# Patient Record
Sex: Female | Born: 1960
Health system: Southern US, Community
[De-identification: ages and names within clinical notes are randomized; demographics above are authoritative.]

## PROBLEM LIST (undated history)

## (undated) DIAGNOSIS — E0590 Thyrotoxicosis, unspecified without thyrotoxic crisis or storm: Secondary | ICD-10-CM

## (undated) DIAGNOSIS — N159 Renal tubulo-interstitial disease, unspecified: Secondary | ICD-10-CM

## (undated) DIAGNOSIS — M6740 Ganglion, unspecified site: Secondary | ICD-10-CM

## (undated) DIAGNOSIS — E785 Hyperlipidemia, unspecified: Secondary | ICD-10-CM

## (undated) DIAGNOSIS — M1990 Unspecified osteoarthritis, unspecified site: Secondary | ICD-10-CM

## (undated) HISTORY — PX: TONSILLECTOMY: SUR1361

## (undated) HISTORY — PX: KNEE ARTHROTOMY: SUR107

---

## 1991-03-01 HISTORY — PX: TUBAL LIGATION: SHX77

## 2005-05-03 ENCOUNTER — Encounter: Admission: RE | Admit: 2005-05-03 | Discharge: 2005-05-03 | Payer: Self-pay | Admitting: *Deleted

## 2006-09-04 ENCOUNTER — Encounter: Admission: RE | Admit: 2006-09-04 | Discharge: 2006-09-04 | Payer: Self-pay | Admitting: *Deleted

## 2011-05-02 ENCOUNTER — Encounter (HOSPITAL_COMMUNITY): Payer: Self-pay | Admitting: Pharmacist

## 2011-05-11 ENCOUNTER — Encounter (HOSPITAL_COMMUNITY): Payer: Self-pay

## 2011-05-11 ENCOUNTER — Encounter (HOSPITAL_COMMUNITY)
Admission: RE | Admit: 2011-05-11 | Discharge: 2011-05-11 | Disposition: A | Discharge status: Home / Self Care | Payer: PRIVATE HEALTH INSURANCE | Source: Ambulatory Visit | Attending: Obstetrics and Gynecology | Admitting: Obstetrics and Gynecology

## 2011-05-11 HISTORY — DX: Renal tubulo-interstitial disease, unspecified: N15.9

## 2011-05-11 HISTORY — DX: Thyrotoxicosis, unspecified without thyrotoxic crisis or storm: E05.90

## 2011-05-11 LAB — SURGICAL PCR SCREEN: MRSA, PCR: NEGATIVE

## 2011-05-11 LAB — CBC
Hemoglobin: 12.9 g/dL (ref 12.0–15.0)
MCH: 31.5 pg (ref 26.0–34.0)
MCHC: 33.4 g/dL (ref 30.0–36.0)
WBC: 4.9 10*3/uL (ref 4.0–10.5)

## 2011-05-11 NOTE — H&P (Signed)
Heather Griffin is an 51 y.o. female G2P2 S/P BTL with complaints of increasing pelvic pressure, urinary frequency and discomfort with intercourse. U/S in office showed various fibroids ranging from about 2.7 to 9.0 cm.  There was a 2.5 cm cyst of right ovary.   Pertinent Gynecological History: Menses: irregular occurring approximately every 30-60 days with spotting approximately few days per month Bleeding: as above Contraception: tubal ligation DES exposure: unknown Blood transfusions: none Sexually transmitted diseases: no past history Previous GYN Procedures: BTL  Last mammogram: normal Date: 2013 Last pap: normal Date: 2013 OB History: G2, P2   Menstrual History: Menarche age: unknown No LMP recorded.    No past medical history on file.  No past surgical history on file.  No family history on file.  Social History:  does not have a smoking history on file. She does not have any smokeless tobacco history on file. Her alcohol and drug histories not on file.  Allergies:  Allergies  Allergen Reactions  . Erythromycin Nausea And Vomiting    No prescriptions prior to admission    Review of Systems  Respiratory: Negative for shortness of breath.   Cardiovascular: Negative for chest pain.  Genitourinary: Positive for frequency.    There were no vitals taken for this visit. Physical Exam  Constitutional: She appears well-developed and well-nourished.  Cardiovascular: Normal rate and regular rhythm.   Respiratory: Effort normal and breath sounds normal.  GI: She exhibits no distension.  Genitourinary:       Uterus 16 weeks sized, irregular contour, nontender Compromised adnexal exam    No results found for this or any previous visit (from the past 24 hour(s)).  No results found.  Assessment/Plan: 51 yo with symptomatic uterine leiomyomata.  Reviewed options. Scheduled for TAH/BSO.  Risks reviewed.  Heather Griffin,Heather Griffin 05/11/2011, 10:39 AM

## 2011-05-11 NOTE — Patient Instructions (Signed)
YOUR PROCEDURE IS SCHEDULED ON:05/16/11  ENTER THROUGH THE MAIN ENTRANCE OF [facility] AT:6am  USE DESK PHONE AND DIAL [phone] TO INFORM us OF YOUR ARRIVAL  CALL [phone] IF YOU HAVE ANY QUESTIONS OR PROBLEMS PRIOR TO YOUR ARRIVAL.  REMEMBER: DO NOT EAT OR DRINK AFTER MIDNIGHT :Sunday  SPECIAL INSTRUCTIONS:   YOU MAY BRUSH YOUR TEETH THE MORNING OF SURGERY   TAKE THESE MEDICINES THE DAY OF SURGERY WITH SIP OF WATER:none   DO NOT WEAR JEWELRY, EYE MAKEUP, LIPSTICK OR DARK FINGERNAIL POLISH DO NOT WEAR LOTIONS  DO NOT SHAVE FOR 48 HOURS PRIOR TO SURGERY  YOU WILL NOT BE ALLOWED TO DRIVE YOURSELF HOME.  NAME OF DRIVER: Jonny Ruiz

## 2011-05-16 ENCOUNTER — Inpatient Hospital Stay (HOSPITAL_COMMUNITY): Payer: PRIVATE HEALTH INSURANCE | Admitting: Anesthesiology

## 2011-05-16 ENCOUNTER — Encounter (HOSPITAL_COMMUNITY): Payer: Self-pay | Admitting: Anesthesiology

## 2011-05-16 ENCOUNTER — Inpatient Hospital Stay (HOSPITAL_COMMUNITY)
Admission: RE | Admit: 2011-05-16 | Discharge: 2011-05-18 | DRG: 743 | Disposition: A | Discharge status: Home / Self Care | Payer: PRIVATE HEALTH INSURANCE | Source: Ambulatory Visit | Attending: Obstetrics and Gynecology | Admitting: Obstetrics and Gynecology

## 2011-05-16 ENCOUNTER — Encounter (HOSPITAL_COMMUNITY): Payer: Self-pay | Admitting: *Deleted

## 2011-05-16 ENCOUNTER — Encounter (HOSPITAL_COMMUNITY)
Admission: RE | Disposition: A | Discharge status: Home / Self Care | Payer: Self-pay | Source: Ambulatory Visit | Attending: Obstetrics and Gynecology

## 2011-05-16 DIAGNOSIS — N8300 Follicular cyst of ovary, unspecified side: Secondary | ICD-10-CM | POA: Diagnosis present

## 2011-05-16 DIAGNOSIS — Z01812 Encounter for preprocedural laboratory examination: Secondary | ICD-10-CM

## 2011-05-16 DIAGNOSIS — Z01818 Encounter for other preprocedural examination: Secondary | ICD-10-CM

## 2011-05-16 DIAGNOSIS — N949 Unspecified condition associated with female genital organs and menstrual cycle: Secondary | ICD-10-CM | POA: Diagnosis present

## 2011-05-16 DIAGNOSIS — D252 Subserosal leiomyoma of uterus: Principal | ICD-10-CM | POA: Diagnosis present

## 2011-05-16 HISTORY — PX: SALPINGOOPHORECTOMY: SHX82

## 2011-05-16 HISTORY — PX: ABDOMINAL HYSTERECTOMY: SHX81

## 2011-05-16 SURGERY — HYSTERECTOMY, ABDOMINAL
Anesthesia: General | Site: Abdomen | Wound class: Clean Contaminated

## 2011-05-16 MED ORDER — ONDANSETRON HCL 4 MG/2ML IJ SOLN: 4.0000 mg | Freq: Four times a day (QID) | INTRAMUSCULAR | Status: DC | PRN

## 2011-05-16 MED ORDER — MEPERIDINE HCL 25 MG/ML IJ SOLN
6.2500 mg | INTRAMUSCULAR | Status: DC | PRN
Start: 2011-05-16 — End: 2011-05-16

## 2011-05-16 MED ORDER — LIDOCAINE HCL (CARDIAC) 20 MG/ML IV SOLN
INTRAVENOUS | Status: AC
  Filled 2011-05-16: qty 5

## 2011-05-16 MED ORDER — HYDROMORPHONE 0.3 MG/ML IV SOLN
INTRAVENOUS | Status: AC
  Filled 2011-05-16: qty 25

## 2011-05-16 MED ORDER — PROPOFOL 10 MG/ML IV EMUL
INTRAVENOUS | Status: AC
  Filled 2011-05-16: qty 20

## 2011-05-16 MED ORDER — ALUM & MAG HYDROXIDE-SIMETH 200-200-20 MG/5ML PO SUSP: 30.0000 mL | ORAL | Status: DC | PRN

## 2011-05-16 MED ORDER — ROCURONIUM BROMIDE 100 MG/10ML IV SOLN
INTRAVENOUS | Status: DC | PRN
  Administered 2011-05-16: 10 mg via INTRAVENOUS
  Administered 2011-05-16: 40 mg via INTRAVENOUS

## 2011-05-16 MED ORDER — DEXAMETHASONE SODIUM PHOSPHATE 4 MG/ML IJ SOLN
INTRAMUSCULAR | Status: DC | PRN
  Administered 2011-05-16: 10 mg via INTRAVENOUS

## 2011-05-16 MED ORDER — NALOXONE HCL 0.4 MG/ML IJ SOLN: 0.4000 mg | INTRAMUSCULAR | Status: DC | PRN

## 2011-05-16 MED ORDER — GLYCOPYRROLATE 0.2 MG/ML IJ SOLN
INTRAMUSCULAR | Status: DC | PRN
  Administered 2011-05-16: 0.2 mg via INTRAVENOUS
  Administered 2011-05-16: 0.4 mg via INTRAVENOUS

## 2011-05-16 MED ORDER — HYDROMORPHONE HCL PF 1 MG/ML IJ SOLN
INTRAMUSCULAR | Status: AC
  Filled 2011-05-16: qty 1

## 2011-05-16 MED ORDER — LACTATED RINGERS IV SOLN
INTRAVENOUS | Status: DC
  Administered 2011-05-16 – 2011-05-17 (×2): via INTRAVENOUS

## 2011-05-16 MED ORDER — FENTANYL CITRATE 0.05 MG/ML IJ SOLN
INTRAMUSCULAR | Status: AC
  Filled 2011-05-16: qty 5

## 2011-05-16 MED ORDER — HYDROMORPHONE HCL PF 1 MG/ML IJ SOLN
INTRAMUSCULAR | Status: AC
  Administered 2011-05-16: 0.5 mg via INTRAVENOUS
  Filled 2011-05-16: qty 1

## 2011-05-16 MED ORDER — SCOPOLAMINE 1 MG/3DAYS TD PT72
1.0000 | MEDICATED_PATCH | TRANSDERMAL | Status: DC
  Administered 2011-05-16: 1.5 mg via TRANSDERMAL

## 2011-05-16 MED ORDER — HYDROMORPHONE HCL PF 1 MG/ML IJ SOLN
0.2500 mg | INTRAMUSCULAR | Status: DC | PRN
  Administered 2011-05-16 (×5): 0.5 mg via INTRAVENOUS

## 2011-05-16 MED ORDER — ONDANSETRON HCL 4 MG/2ML IJ SOLN
INTRAMUSCULAR | Status: AC
  Filled 2011-05-16: qty 2

## 2011-05-16 MED ORDER — SCOPOLAMINE 1 MG/3DAYS TD PT72: 1.0000 | MEDICATED_PATCH | Freq: Once | TRANSDERMAL | Status: DC

## 2011-05-16 MED ORDER — KETOROLAC TROMETHAMINE 30 MG/ML IJ SOLN
30.0000 mg | Freq: Four times a day (QID) | INTRAMUSCULAR | Status: AC
  Administered 2011-05-16 (×2): 30 mg via INTRAVENOUS
  Filled 2011-05-16 (×2): qty 1

## 2011-05-16 MED ORDER — HYDROMORPHONE HCL PF 1 MG/ML IJ SOLN
INTRAMUSCULAR | Status: DC | PRN
  Administered 2011-05-16: 1 mg via INTRAVENOUS

## 2011-05-16 MED ORDER — DIPHENHYDRAMINE HCL 12.5 MG/5ML PO ELIX: 12.5000 mg | ORAL_SOLUTION | Freq: Four times a day (QID) | ORAL | Status: DC | PRN

## 2011-05-16 MED ORDER — MIDAZOLAM HCL 5 MG/5ML IJ SOLN
INTRAMUSCULAR | Status: DC | PRN
  Administered 2011-05-16: 2 mg via INTRAVENOUS

## 2011-05-16 MED ORDER — ROCURONIUM BROMIDE 50 MG/5ML IV SOLN
INTRAVENOUS | Status: AC
  Filled 2011-05-16: qty 1

## 2011-05-16 MED ORDER — GLYCOPYRROLATE 0.2 MG/ML IJ SOLN
INTRAMUSCULAR | Status: AC
  Filled 2011-05-16: qty 1

## 2011-05-16 MED ORDER — ONDANSETRON HCL 4 MG PO TABS: 4.0000 mg | ORAL_TABLET | Freq: Four times a day (QID) | ORAL | Status: DC | PRN

## 2011-05-16 MED ORDER — LACTATED RINGERS IV SOLN
INTRAVENOUS | Status: DC
  Administered 2011-05-16 (×3): via INTRAVENOUS

## 2011-05-16 MED ORDER — FENTANYL CITRATE 0.05 MG/ML IJ SOLN
INTRAMUSCULAR | Status: DC | PRN
  Administered 2011-05-16: 100 ug via INTRAVENOUS
  Administered 2011-05-16: 150 ug via INTRAVENOUS

## 2011-05-16 MED ORDER — SODIUM CHLORIDE 0.9 % IJ SOLN: 9.0000 mL | INTRAMUSCULAR | Status: DC | PRN

## 2011-05-16 MED ORDER — LIDOCAINE HCL (CARDIAC) 20 MG/ML IV SOLN
INTRAVENOUS | Status: DC | PRN
  Administered 2011-05-16: 50 mg via INTRAVENOUS

## 2011-05-16 MED ORDER — MIDAZOLAM HCL 2 MG/2ML IJ SOLN
INTRAMUSCULAR | Status: AC
  Filled 2011-05-16: qty 2

## 2011-05-16 MED ORDER — DIPHENHYDRAMINE HCL 50 MG/ML IJ SOLN: 12.5000 mg | Freq: Four times a day (QID) | INTRAMUSCULAR | Status: DC | PRN

## 2011-05-16 MED ORDER — METOCLOPRAMIDE HCL 5 MG/ML IJ SOLN: 10.0000 mg | Freq: Once | INTRAMUSCULAR | Status: DC | PRN

## 2011-05-16 MED ORDER — OXYCODONE-ACETAMINOPHEN 5-325 MG PO TABS
1.0000 | ORAL_TABLET | ORAL | Status: DC | PRN
  Administered 2011-05-17: 1 via ORAL
  Administered 2011-05-18 (×2): 2 via ORAL
  Filled 2011-05-16 (×2): qty 2
  Filled 2011-05-16: qty 1

## 2011-05-16 MED ORDER — KETOROLAC TROMETHAMINE 30 MG/ML IJ SOLN
INTRAMUSCULAR | Status: DC | PRN
  Administered 2011-05-16: 30 mg via INTRAVENOUS

## 2011-05-16 MED ORDER — ZOLPIDEM TARTRATE 5 MG PO TABS: 5.0000 mg | ORAL_TABLET | Freq: Every evening | ORAL | Status: DC | PRN

## 2011-05-16 MED ORDER — CEFAZOLIN SODIUM 1-5 GM-% IV SOLN
1.0000 g | INTRAVENOUS | Status: AC
  Administered 2011-05-16: 1 g via INTRAVENOUS

## 2011-05-16 MED ORDER — DOCUSATE SODIUM 100 MG PO CAPS
100.0000 mg | ORAL_CAPSULE | Freq: Two times a day (BID) | ORAL | Status: DC
  Administered 2011-05-16 – 2011-05-18 (×4): 100 mg via ORAL
  Filled 2011-05-16 (×4): qty 1

## 2011-05-16 MED ORDER — PROPOFOL 10 MG/ML IV EMUL
INTRAVENOUS | Status: DC | PRN
  Administered 2011-05-16: 150 mg via INTRAVENOUS

## 2011-05-16 MED ORDER — HYDROMORPHONE 0.3 MG/ML IV SOLN
INTRAVENOUS | Status: DC
  Administered 2011-05-16: 10:00:00 via INTRAVENOUS
  Administered 2011-05-16: 1.33 mg via INTRAVENOUS
  Administered 2011-05-17: 0.67 mL via INTRAVENOUS

## 2011-05-16 MED ORDER — SCOPOLAMINE 1 MG/3DAYS TD PT72
MEDICATED_PATCH | TRANSDERMAL | Status: AC
  Administered 2011-05-16: 1.5 mg via TRANSDERMAL
  Filled 2011-05-16: qty 1

## 2011-05-16 MED ORDER — NEOSTIGMINE METHYLSULFATE 1 MG/ML IJ SOLN
INTRAMUSCULAR | Status: AC
  Filled 2011-05-16: qty 10

## 2011-05-16 MED ORDER — KETOROLAC TROMETHAMINE 30 MG/ML IJ SOLN
INTRAMUSCULAR | Status: AC
  Filled 2011-05-16: qty 1

## 2011-05-16 MED ORDER — ONDANSETRON HCL 4 MG/2ML IJ SOLN
INTRAMUSCULAR | Status: DC | PRN
  Administered 2011-05-16: 4 mg via INTRAVENOUS

## 2011-05-16 MED ORDER — CEFAZOLIN SODIUM 1-5 GM-% IV SOLN
INTRAVENOUS | Status: AC
  Filled 2011-05-16: qty 50

## 2011-05-16 MED ORDER — MENTHOL 3 MG MT LOZG: 1.0000 | LOZENGE | OROMUCOSAL | Status: DC | PRN

## 2011-05-16 MED ORDER — NEOSTIGMINE METHYLSULFATE 1 MG/ML IJ SOLN
INTRAMUSCULAR | Status: DC | PRN
  Administered 2011-05-16: 2 mg via INTRAVENOUS

## 2011-05-16 SURGICAL SUPPLY — 32 items
APL SKNCLS STERI-STRIP NONHPOA (GAUZE/BANDAGES/DRESSINGS) ×2
BENZOIN TINCTURE PRP APPL 2/3 (GAUZE/BANDAGES/DRESSINGS) ×1 IMPLANT
CANISTER SUCTION 2500CC (MISCELLANEOUS) ×3 IMPLANT
CLOTH BEACON ORANGE TIMEOUT ST (SAFETY) ×3 IMPLANT
CONT PATH 16OZ SNAP LID 3702 (MISCELLANEOUS) ×3 IMPLANT
DECANTER SPIKE VIAL GLASS SM (MISCELLANEOUS) IMPLANT
DRESSING TELFA 8X3 (GAUZE/BANDAGES/DRESSINGS) ×1 IMPLANT
ELECT LIGASURE SHORT 9 REUSE (ELECTRODE) IMPLANT
GLOVE BIO SURGEON STRL SZ8 (GLOVE) ×6 IMPLANT
GOWN PREVENTION PLUS LG XLONG (DISPOSABLE) ×6 IMPLANT
NS IRRIG 1000ML POUR BTL (IV SOLUTION) ×3 IMPLANT
PACK ABDOMINAL GYN (CUSTOM PROCEDURE TRAY) ×3 IMPLANT
PAD ABD 7.5X8 STRL (GAUZE/BANDAGES/DRESSINGS) ×1 IMPLANT
PAD OB MATERNITY 4.3X12.25 (PERSONAL CARE ITEMS) ×3 IMPLANT
PROTECTOR NERVE ULNAR (MISCELLANEOUS) ×3 IMPLANT
SPONGE GAUZE 4X4 12PLY (GAUZE/BANDAGES/DRESSINGS) ×1 IMPLANT
SPONGE LAP 18X18 X RAY DECT (DISPOSABLE) ×6 IMPLANT
STAPLER VISISTAT 35W (STAPLE) ×3 IMPLANT
STRIP CLOSURE SKIN 1/4X3 (GAUZE/BANDAGES/DRESSINGS) ×1 IMPLANT
STRIP CLOSURE SKIN 1/4X4 (GAUZE/BANDAGES/DRESSINGS) ×3 IMPLANT
SUT MNCRL 0 MO-4 VIOLET 18 CR (SUTURE) ×6 IMPLANT
SUT MNCRL 0 VIOLET 6X18 (SUTURE) ×2 IMPLANT
SUT MNCRL AB 0 CT1 27 (SUTURE) ×3 IMPLANT
SUT MONOCRYL 0 6X18 (SUTURE) ×1
SUT MONOCRYL 0 MO 4 18  CR/8 (SUTURE) ×3
SUT PDS AB 0 CTX 60 (SUTURE) ×6 IMPLANT
SUT PLAIN 2 0 XLH (SUTURE) IMPLANT
SYR CONTROL 10ML LL (SYRINGE) IMPLANT
TAPE CLOTH SURG 4X10 WHT LF (GAUZE/BANDAGES/DRESSINGS) ×1 IMPLANT
TOWEL OR 17X24 6PK STRL BLUE (TOWEL DISPOSABLE) ×6 IMPLANT
TRAY FOLEY CATH 14FR (SET/KITS/TRAYS/PACK) ×3 IMPLANT
WATER STERILE IRR 1000ML POUR (IV SOLUTION) ×2 IMPLANT

## 2011-05-16 NOTE — Anesthesia Postprocedure Evaluation (Signed)
  Anesthesia Post-op Note  Patient: Heather Griffin  Procedure(s) Performed: Procedure(s) (LRB): HYSTERECTOMY ABDOMINAL (N/A) SALPINGO OOPHERECTOMY (Bilateral)  Patient Location: [facility]  Anesthesia Type: General  Level of Consciousness: alert  and oriented  Airway and Oxygen Therapy: Patient Spontanous Breathing  Post-op Pain: mild  Post-op Assessment: Patient's Cardiovascular Status Stable and Respiratory Function Stable  Post-op Vital Signs: stable  Complications: No apparent anesthesia complications

## 2011-05-16 NOTE — Anesthesia Postprocedure Evaluation (Signed)
  Anesthesia Post-op Note  Patient: Heather Griffin  Procedure(s) Performed: Procedure(s) (LRB): HYSTERECTOMY ABDOMINAL (N/A) SALPINGO OOPHERECTOMY (Bilateral)  Patient Location: PACU  Anesthesia Type: General  Level of Consciousness: awake, alert  and oriented  Airway and Oxygen Therapy: Patient Spontanous Breathing  Post-op Pain: mild  Post-op Assessment: Post-op Vital signs reviewed, Patient's Cardiovascular Status Stable, Respiratory Function Stable, Patent Airway, No signs of Nausea or vomiting, Adequate PO intake and Pain level controlled  Post-op Vital Signs: Reviewed and stable  Complications: No apparent anesthesia complications

## 2011-05-16 NOTE — Anesthesia Preprocedure Evaluation (Signed)
Anesthesia Evaluation  Patient identified by MRN, date of birth, ID band Patient awake    Reviewed: Allergy & Precautions, H&P , Patient's Chart, lab work & pertinent test results  Airway Mallampati: II TM Distance: >3 FB Neck ROM: full    Dental No notable dental hx. (+) Teeth Intact   Pulmonary neg pulmonary ROS,  breath sounds clear to auscultation  Pulmonary exam normal       Cardiovascular negative cardio ROS  Rhythm:regular Rate:Normal     Neuro/Psych negative neurological ROS  negative psych ROS   GI/Hepatic negative GI ROS, Neg liver ROS,   Endo/Other  Hyperthyroidism   Renal/GU negative Renal ROS  negative genitourinary   Musculoskeletal   Abdominal   Peds  Hematology negative hematology ROS (+)   Anesthesia Other Findings   Reproductive/Obstetrics (+) Pregnancy                           Anesthesia Physical Anesthesia Plan  ASA: II  Anesthesia Plan: General ETT   Post-op Pain Management:    Induction:   Airway Management Planned:   Additional Equipment:   Intra-op Plan:   Post-operative Plan:   Informed Consent: I have reviewed the patients History and Physical, chart, labs and discussed the procedure including the risks, benefits and alternatives for the proposed anesthesia with the patient or authorized representative who has indicated his/her understanding and acceptance.   Dental Advisory Given  Plan Discussed with: Anesthesiologist, Surgeon and CRNA  Anesthesia Plan Comments:         Anesthesia Quick Evaluation

## 2011-05-16 NOTE — Op Note (Signed)
Heather Griffin, BEVERLEY NO.:  [id number]  MEDICAL RECORD NO.:  [id number]  LOCATION:  9303                          FACILITY:  [facility]  PHYSICIAN:  Guy Sandifer. Henderson Cloud, M.D. DATE OF BIRTH:  [birth year]  DATE OF PROCEDURE:  05/16/2011 DATE OF DISCHARGE:                              OPERATIVE REPORT   PREOPERATIVE DIAGNOSIS:  Uterine leiomyomata.  POSTOPERATIVE DIAGNOSIS:  Uterine leiomyomata.  PROCEDURE:  Total abdominal hysterectomy with bilateral salpingo- oophorectomy.  SURGEON:  Guy Sandifer. Henderson Cloud, MD.  ASSISTANTDuke Salvia. Marcelle Overlie, MD.  ANESTHESIA:  General endotracheal intubation.  ANESTHESIOLOGIST:  Angelica Pou, MD.  ESTIMATED BLOOD LOSS:  150 mL.  SPECIMENS:  Uterus with cervix, bilateral tubes and ovaries to Pathology.  ESTIMATED BLOOD LOSS:  150 mL.  INDICATIONS AND CONSENT: This patient is a 51 year old married white female with symptomatic uterine leiomyomata.  After discussion of options, she is admitted for total abdominal hysterectomy with bilateral salpingo-oophorectomy. Procedures have been discussed preoperatively.  Potential risks and complications have been reviewed preoperatively including but not limited to infection, organ damage, bleeding requiring transfusion of blood products with HIV and hepatitis acquisition, DVT, PE, pneumonia, fistula formation, pelvic pain.  All questions have been answered and consent is signed on the chart.  FINDINGS:  Uterus is distorted with multiple leiomyomata both at the fundus and posterior uterine fundus to about 14-16 weeks size.  Tubes and ovaries normal bilaterally.  PROCEDURE IN DETAIL:  The patient was taken to the operating room, where she was identified, placed in dorsal supine position, and general anesthesia was induced via endotracheal intubation.  She was prepped abdominally and vaginally.  Foley catheter was placed and the bladder was drained.  She has draped in sterile fashion.   Time-out undertaken. Skin was entered through a Pfannenstiel incision and dissection was carried out in layers to the peritoneum, which was entered and extended superiorly and inferiorly.  Andrey Spearman retractor was placed. Bladder blade was placed.  The bowel was packed away and blade was placed.  After elevating specimen, Kelly clamps were placed on the proximal ligaments.  The left round ligament was identified, taken down with bipolar cautery, and anterior leaf of the broad ligament was taken down around the vesicouterine peritoneum as well.  Posterior leaf was bluntly perforated.  After verifying it is well clear of the pelvic sidewall and the ureter, the infundibulopelvic ligament was clamped, taken down, and doubly ligated with a free tie and a suture.  The left uterine vessels were further skeletonized and the bladder was advanced. Progressive bites with straight clamps were taken down to the uterine vessels.  Similar procedure was carried out on the right side.  After getting well below the uterine vessels bilaterally, the uterine fundus was amputated under good visualization.  At this point, uterosacral ligaments were taken down bilaterally with curved clamps, the vagina was entered and the specimen was removed with scissors.  The entire cervix was removed.  Cuff was then closed with interrupted figure-of-eight sutures.  All suture of 0 Monocryl unless otherwise designated. Uterosacral ligaments are plicated in the midline with a suture at the distal most portion of the uterosacral ligament and  the angle sutures are tied in the midline as well.  Copious irrigation was carried out and excellent hemostasis was noted all around.  Course of the ureters was identified again bilaterally and seem to be well clear of the area of surgery.  Packs were removed.  Retractors were removed.  Anterior peritoneum was closed in running fashion with 0 Monocryl suture, which was also used to  reapproximate the pyramidalis muscle in the midline. Anterior rectus fascia was closed in a running fashion with a 0 looped PDS.  The subcuticular tissue was closed with interrupted plain suture and the skin was closed with a subcuticular Vicryl on a Keith needle. Dressings were applied.  All counts correct.  The patient was awakened and taken to recovery room in stable condition.     Guy Sandifer Henderson Cloud, M.D.     JET/MEDQ  D:  05/16/2011  T:  05/16/2011  Job:  [id number]

## 2011-05-16 NOTE — Brief Op Note (Signed)
05/16/2011  8:39 AM  PATIENT:  Heather Griffin  51 y.o. female with symptomatic uterine leiyomata PRE-OPERATIVE DIAGNOSIS:  fibroids  POST-OPERATIVE DIAGNOSIS:  fibroids  PROCEDURE:  Procedure(s) (LRB): Total HYSTERECTOMY ABDOMINAL (N/A) SALPINGO OOPHERECTOMY (Bilateral)  SURGEON:  Surgeon(s) and Role:    * Leslie Andrea, MD - Primary  PHYSICIAN ASSISTANT:   ASSISTANTS: Richarda Overlie MD   ANESTHESIA:   general  EBL:  Total I/O In: 1000 [I.V.:1000] Out: 450 [Urine:300; Blood:150]  BLOOD ADMINISTERED:none  DRAINS: Urinary Catheter (Foley)   LOCAL MEDICATIONS USED:  NONE  SPECIMEN:  Source of Specimen:  Uterus, bilateral tubes/ovaries  DISPOSITION OF SPECIMEN:  PATHOLOGY  COUNTS:  YES  TOURNIQUET:  * No tourniquets in log *  DICTATION: .Other Dictation: Dictation Number   PLAN OF CARE: Admit to inpatient   PATIENT DISPOSITION:  PACU - hemodynamically stable.   Delay start of Pharmacological VTE agent (>24hrs) due to surgical blood loss or risk of bleeding: not applicable

## 2011-05-16 NOTE — Addendum Note (Signed)
Addendum  created 05/16/11 1602 by Renford Dills, CRNA   Modules edited:Notes Section

## 2011-05-16 NOTE — Progress Notes (Signed)
No changes to H&P  Reviewed TAH/BSO-risks, all questions answered

## 2011-05-16 NOTE — Anesthesia Procedure Notes (Signed)
Date/Time: 05/16/2011 7:31 AM Performed by: Shanon Payor Pre-anesthesia Checklist: Patient identified, Emergency Drugs available, Suction available, Patient being monitored and Timeout performed Patient Re-evaluated:Patient Re-evaluated prior to inductionOxygen Delivery Method: Circle system utilized Preoxygenation: Pre-oxygenation with 100% oxygen Intubation Type: IV induction Ventilation: Mask ventilation without difficulty Laryngoscope Size: Mac and 3 Grade View: Grade II Tube type: Oral Tube size: 7.0 mm Number of attempts: 1 Airway Equipment and Method: Stylet Placement Confirmation: ETT inserted through vocal cords under direct vision,  positive ETCO2 and breath sounds checked- equal and bilateral Secured at: 20 cm Tube secured with: Tape Dental Injury: Teeth and Oropharynx as per pre-operative assessment

## 2011-05-16 NOTE — Transfer of Care (Signed)
Immediate Anesthesia Transfer of Care Note  Patient: Heather Griffin  Procedure(s) Performed: Procedure(s) (LRB): HYSTERECTOMY ABDOMINAL (N/A) SALPINGO OOPHERECTOMY (Bilateral)  Patient Location: PACU  Anesthesia Type: General  Level of Consciousness: awake, alert  and oriented  Airway & Oxygen Therapy: Patient Spontanous Breathing and Patient connected to nasal cannula oxygen  Post-op Assessment: Report given to PACU RN and Post -op Vital signs reviewed and stable  Post vital signs: Reviewed and stable  Complications: No apparent anesthesia complications

## 2011-05-16 NOTE — Progress Notes (Signed)
Ambulating, fair pain control, no N/V  Blood pressure 144/82, pulse 81, temperature 99.4 F (37.4 C), temperature source Oral, resp. rate 14, height 5' 4.5" (1.638 m), weight 63.957 kg (141 lb), SpO2 98.00%.  UO clear  Lungs CTA Cor RRR Abd soft, BS pos, dressing C&D Ext PAS on  A: satisfactory P: per orders

## 2011-05-17 LAB — CBC
Hemoglobin: 11.5 g/dL — ABNORMAL LOW (ref 12.0–15.0)
MCHC: 33.2 g/dL (ref 30.0–36.0)
RBC: 3.62 MIL/uL — ABNORMAL LOW (ref 3.87–5.11)

## 2011-05-17 NOTE — Progress Notes (Signed)
UR Chart review completed.  

## 2011-05-17 NOTE — Progress Notes (Signed)
POD #1  Pain control OK, passing flatus, voiding, ambulating  Blood pressure 115/72, pulse 69, temperature 98.2 F (36.8 C), temperature source Oral, resp. rate 18, height 5' 4.5" (1.638 m), weight 63.957 kg (141 lb), SpO2 96.00%.  Lungs CTA Cor RRR Abd soft, BS pos Ext PAS on  A: satisfactory P: check CBC

## 2011-05-18 ENCOUNTER — Encounter (HOSPITAL_COMMUNITY): Payer: Self-pay | Admitting: Obstetrics and Gynecology

## 2011-05-18 MED ORDER — OXYCODONE-ACETAMINOPHEN 5-325 MG PO TABS
1.0000 | ORAL_TABLET | Freq: Four times a day (QID) | ORAL | Status: AC | PRN
Start: 2011-05-18 — End: 2011-05-28

## 2011-05-18 NOTE — Discharge Summary (Signed)
Physician Discharge Summary  Patient ID: Heather Griffin MRN: [id number] DOB/AGE: 1960-10-05 51 y.o.  Admit date: 05/16/2011 Discharge date: 05/18/2011  Admission Diagnoses:  Discharge Diagnoses:  Active Problems:  * No active hospital problems. *    Discharged Condition: good  Hospital Course: Admitted to OR for TAH/BSO with uterine leiomyomata.  Postopertively had good resumption of bowel function, voiding and pain control.  Consults: None  Significant Diagnostic Studies: labs:  Results for orders placed during the hospital encounter of 05/16/11 (from the past 24 hour(s))  CBC     Status: Abnormal   Collection Time   05/17/11  2:17 PM      Component Value Range   WBC 6.9  4.0 - 10.5 (K/uL)   RBC 3.62 (*) 3.87 - 5.11 (MIL/uL)   Hemoglobin 11.5 (*) 12.0 - 15.0 (g/dL)   HCT 40.9 (*) 81.1 - 46.0 (%)   MCV 95.6  78.0 - 100.0 (fL)   MCH 31.8  26.0 - 34.0 (pg)   MCHC 33.2  30.0 - 36.0 (g/dL)   RDW 91.4  78.2 - 95.6 (%)   Platelets 227  150 - 400 (K/uL)    Treatments: IV hydration and surgery: TAH/BSO  Discharge Exam: Blood pressure 127/75, pulse 58, temperature 98.3 F (36.8 C), temperature source Oral, resp. rate 18, height 5' 4.5" (1.638 m), weight 63.957 kg (141 lb), SpO2 95.00%. General appearance: alert, cooperative and no distress Resp: clear to auscultation bilaterally GI: soft, non-tender; bowel sounds normal; no masses,  no organomegaly  Disposition: Final discharge disposition not confirmed   Medication List  As of 05/18/2011  9:11 AM   TAKE these medications         cholecalciferol 1000 UNITS tablet   Commonly known as: VITAMIN D   Take 2,000 Units by mouth every morning.      ibuprofen 200 MG tablet   Commonly known as: ADVIL,MOTRIN   Take 600 mg by mouth 2 (two) times daily.      oxyCODONE-acetaminophen 5-325 MG per tablet   Commonly known as: PERCOCET   Take 1-2 tablets by mouth every 6 (six) hours as needed (moderate to severe pain (when tolerating  fluids)).           Follow-up Information    Follow up with Jamien Casanova II,Fleming Prill E, MD in 3 weeks.   Contact information:   [address] [place] [address] [phone]          Signed: Leslie Andrea 05/18/2011, 9:11 AM

## 2011-05-18 NOTE — Discharge Instructions (Signed)
No vaginal entry °No heavy lifting °

## 2011-05-18 NOTE — Progress Notes (Signed)
Pt discharged to home with husband.  Condition stable.  Pt ambulated to car with C. Riley, NT.  No equipment for home ordered at discharge. 

## 2011-05-18 NOTE — Progress Notes (Signed)
POD #2  Voiding, passing flatus, tolerating regular diet, good pain control, ambulating  Blood pressure 127/75, pulse 58, temperature 98.3 F (36.8 C), temperature source Oral, resp. rate 18, height 5' 4.5" (1.638 m), weight 63.957 kg (141 lb), SpO2 95.00%.  Abdomen soft, BS +, dressing C&D  Results for orders placed during the hospital encounter of 05/16/11 (from the past 24 hour(s))  CBC     Status: Abnormal   Collection Time   05/17/11  2:17 PM      Component Value Range   WBC 6.9  4.0 - 10.5 (K/uL)   RBC 3.62 (*) 3.87 - 5.11 (MIL/uL)   Hemoglobin 11.5 (*) 12.0 - 15.0 (g/dL)   HCT 16.1 (*) 09.6 - 46.0 (%)   MCV 95.6  78.0 - 100.0 (fL)   MCH 31.8  26.0 - 34.0 (pg)   MCHC 33.2  30.0 - 36.0 (g/dL)   RDW 04.5  40.9 - 81.1 (%)   Platelets 227  150 - 400 (K/uL)  A: Satisfactory  P: D/C home     Instructions given

## 2014-07-30 ENCOUNTER — Other Ambulatory Visit: Payer: Self-pay | Admitting: Obstetrics and Gynecology

## 2014-07-31 LAB — CYTOLOGY - PAP

## 2014-08-06 ENCOUNTER — Other Ambulatory Visit: Payer: Self-pay | Admitting: Obstetrics and Gynecology

## 2014-08-06 DIAGNOSIS — R928 Other abnormal and inconclusive findings on diagnostic imaging of breast: Secondary | ICD-10-CM

## 2014-08-13 ENCOUNTER — Ambulatory Visit
Admission: RE | Admit: 2014-08-13 | Discharge: 2014-08-13 | Disposition: A | Discharge status: Home / Self Care | Payer: 59 | Source: Ambulatory Visit | Attending: Obstetrics and Gynecology | Admitting: Obstetrics and Gynecology

## 2014-08-13 DIAGNOSIS — R928 Other abnormal and inconclusive findings on diagnostic imaging of breast: Secondary | ICD-10-CM

## 2016-06-07 DIAGNOSIS — H25013 Cortical age-related cataract, bilateral: Secondary | ICD-10-CM | POA: Diagnosis not present

## 2016-09-21 IMAGING — MG MM DIAG BREAST TOMO UNI RIGHT
6 series · 6 of 18 positions shown · non-contrast
Comparison: Previous examinations.

CLINICAL DATA: Recall from screening mammogram.

EXAM:
DIGITAL DIAGNOSTIC RIGHT MAMMOGRAM WITH 3D TOMOSYNTHESIS AND CAD

[R CC (1 of 2)]
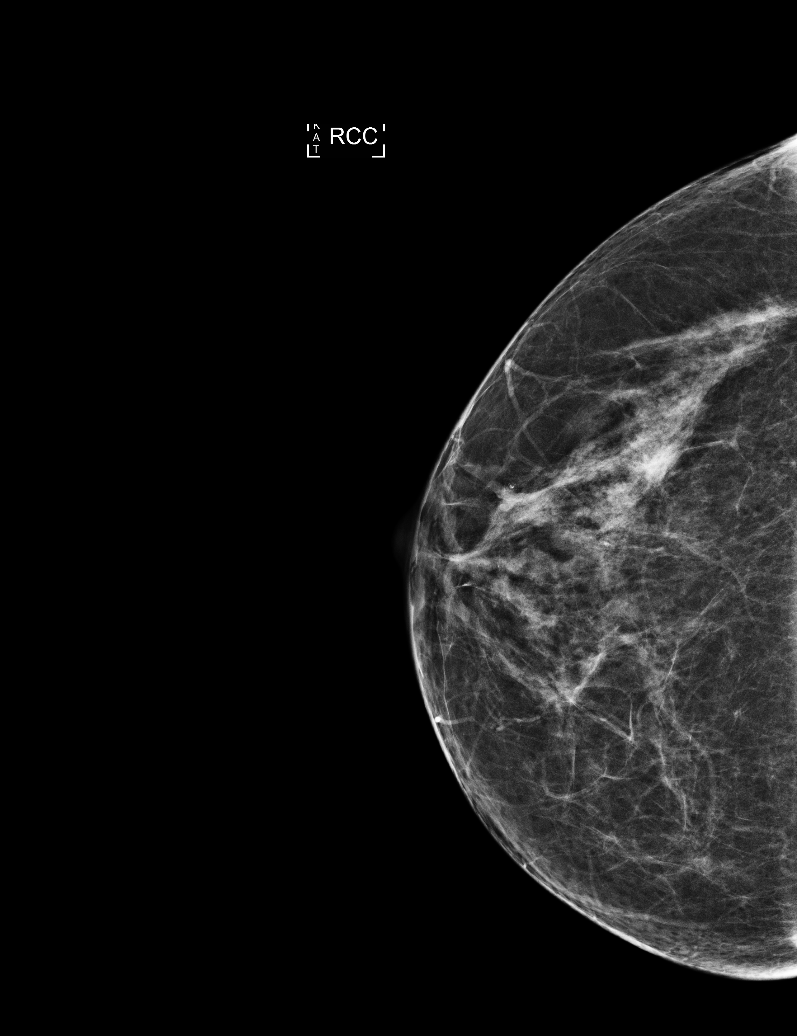

[R MLO]
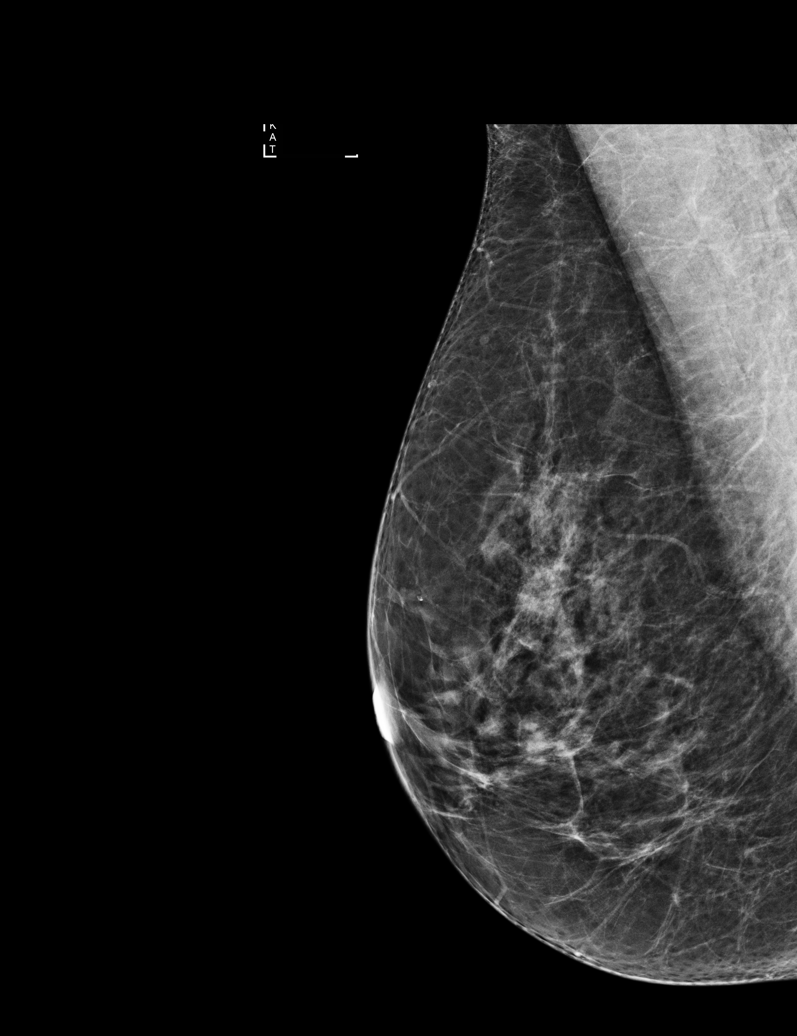

[R CC (2 of 2)]
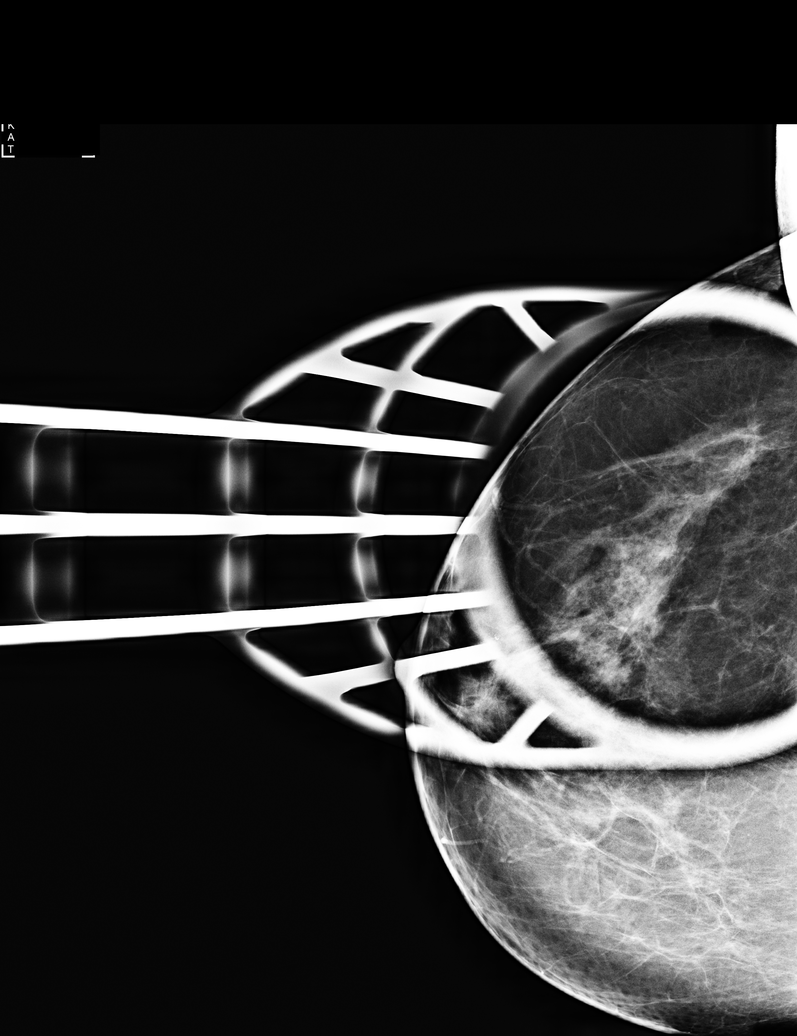

[R CC tomo (1 of 2) · tomo slice 27/53.0]
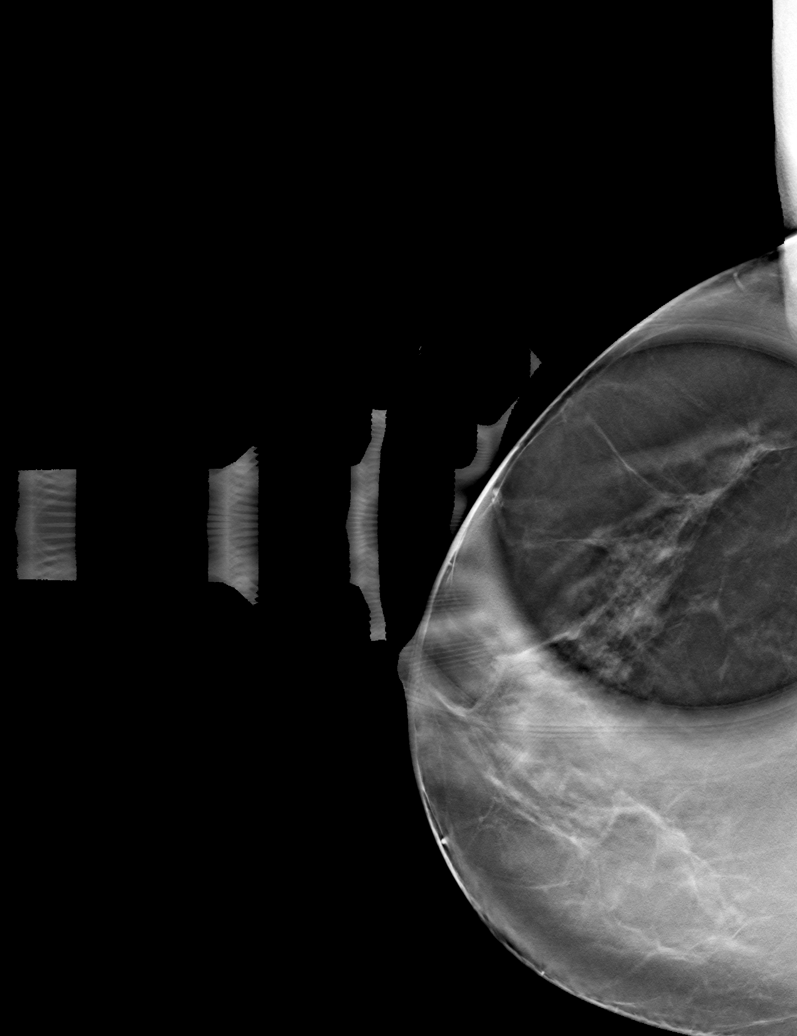

[R CC tomo (2 of 2) · tomo slice 29/57.0]
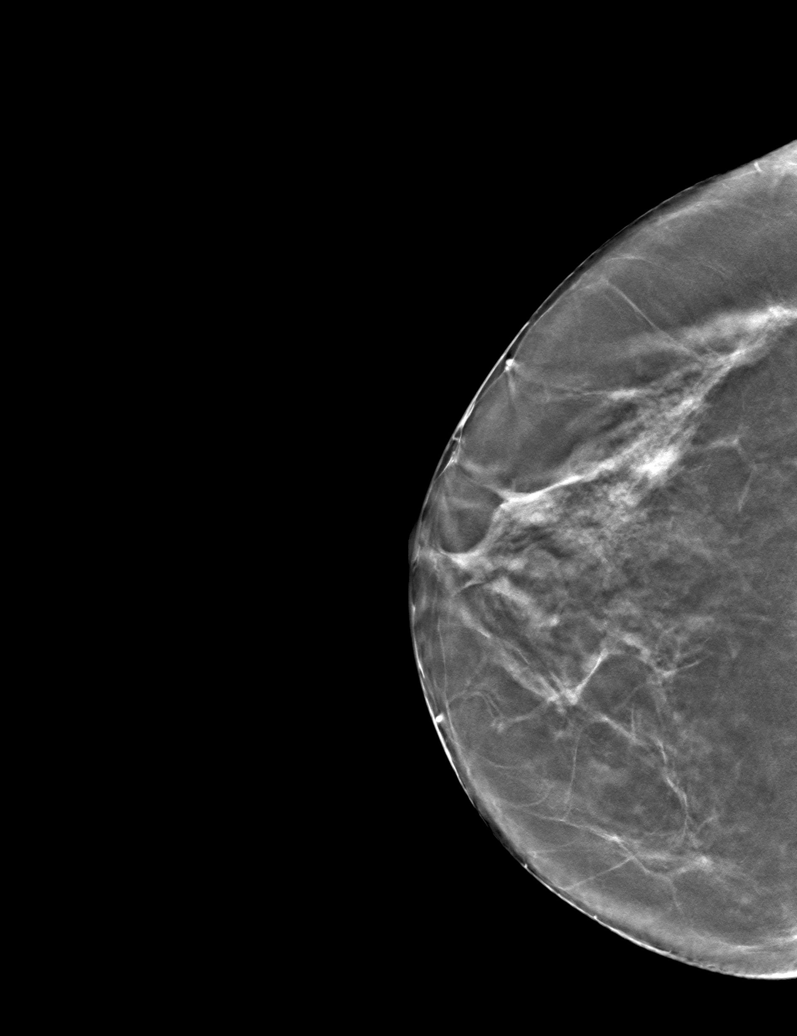

[R MLO tomo · tomo slice 29/56.0]
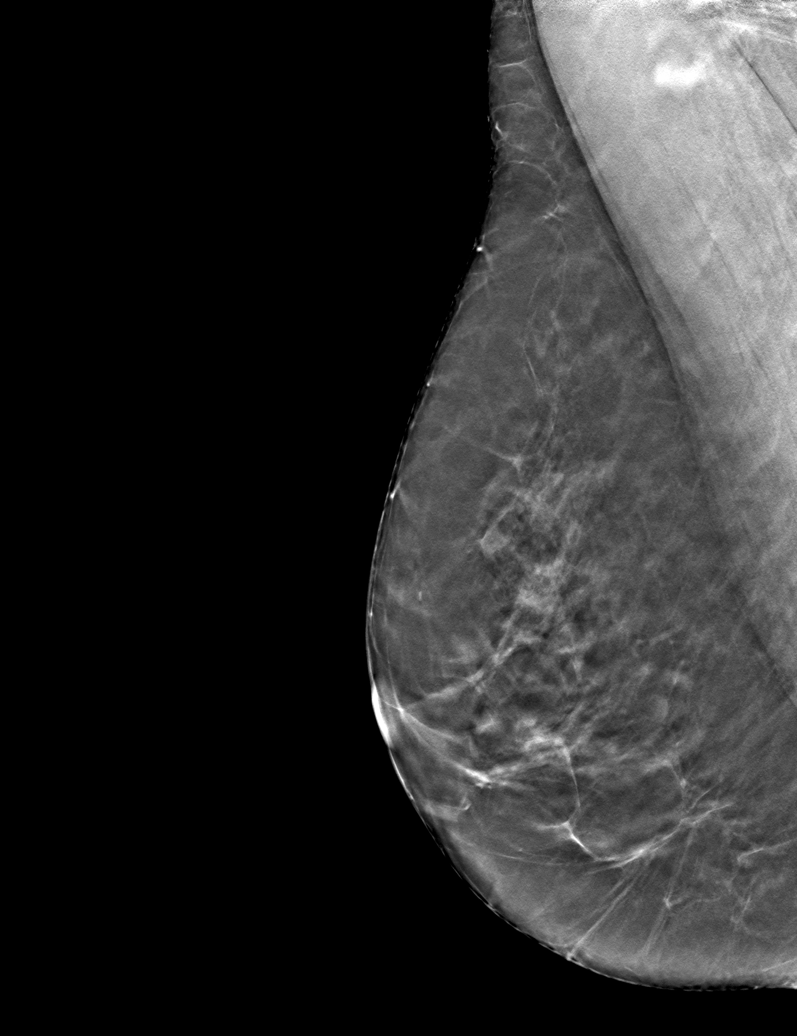

[6 of 18 positions shown; findings below may reference images not displayed]

ACR Breast Density Category b: There are scattered areas of
fibroglandular density.
FINDINGS: Additional views of the right breast demonstrate no persistent
abnormality. The appearance is consistent with a summation shadow.

Mammographic images were processed with CAD.
IMPRESSION: No persistent abnormality on additional evaluation of the right
breast.

RECOMMENDATION:
Screening mammography 1 year.

I have discussed the findings and recommendations with the patient.
Results were also provided in writing at the conclusion of the
visit. If applicable, a reminder letter will be sent to the patient
regarding the next appointment.

BI-RADS CATEGORY  1: Negative.

## 2016-10-04 DIAGNOSIS — Z01419 Encounter for gynecological examination (general) (routine) without abnormal findings: Secondary | ICD-10-CM | POA: Diagnosis not present

## 2016-10-04 DIAGNOSIS — Z6824 Body mass index (BMI) 24.0-24.9, adult: Secondary | ICD-10-CM | POA: Diagnosis not present

## 2016-11-21 DIAGNOSIS — Z0000 Encounter for general adult medical examination without abnormal findings: Secondary | ICD-10-CM | POA: Diagnosis not present

## 2016-11-21 DIAGNOSIS — Z136 Encounter for screening for cardiovascular disorders: Secondary | ICD-10-CM | POA: Diagnosis not present

## 2016-11-21 DIAGNOSIS — E559 Vitamin D deficiency, unspecified: Secondary | ICD-10-CM | POA: Diagnosis not present

## 2016-11-29 DIAGNOSIS — Z0000 Encounter for general adult medical examination without abnormal findings: Secondary | ICD-10-CM | POA: Diagnosis not present

## 2017-01-03 DIAGNOSIS — Z0184 Encounter for antibody response examination: Secondary | ICD-10-CM | POA: Diagnosis not present

## 2017-01-03 DIAGNOSIS — Z23 Encounter for immunization: Secondary | ICD-10-CM | POA: Diagnosis not present

## 2017-01-03 DIAGNOSIS — Z0000 Encounter for general adult medical examination without abnormal findings: Secondary | ICD-10-CM | POA: Diagnosis not present

## 2017-01-10 DIAGNOSIS — Z0000 Encounter for general adult medical examination without abnormal findings: Secondary | ICD-10-CM | POA: Diagnosis not present

## 2017-01-10 DIAGNOSIS — E785 Hyperlipidemia, unspecified: Secondary | ICD-10-CM | POA: Diagnosis not present

## 2017-07-06 DIAGNOSIS — H25813 Combined forms of age-related cataract, bilateral: Secondary | ICD-10-CM | POA: Diagnosis not present

## 2017-07-18 DIAGNOSIS — M71342 Other bursal cyst, left hand: Secondary | ICD-10-CM | POA: Diagnosis not present

## 2017-07-31 DIAGNOSIS — M6740 Ganglion, unspecified site: Secondary | ICD-10-CM | POA: Insufficient documentation

## 2017-07-31 DIAGNOSIS — M19042 Primary osteoarthritis, left hand: Secondary | ICD-10-CM | POA: Diagnosis not present

## 2017-08-01 ENCOUNTER — Other Ambulatory Visit: Payer: Self-pay | Admitting: Orthopedic Surgery

## 2017-08-02 ENCOUNTER — Other Ambulatory Visit: Payer: Self-pay

## 2017-08-02 ENCOUNTER — Encounter (HOSPITAL_BASED_OUTPATIENT_CLINIC_OR_DEPARTMENT_OTHER): Payer: Self-pay | Admitting: *Deleted

## 2017-08-10 ENCOUNTER — Other Ambulatory Visit: Payer: Self-pay

## 2017-08-10 ENCOUNTER — Encounter (HOSPITAL_BASED_OUTPATIENT_CLINIC_OR_DEPARTMENT_OTHER)
Admission: RE | Disposition: A | Discharge status: Home / Self Care | Payer: Self-pay | Source: Ambulatory Visit | Attending: Orthopedic Surgery

## 2017-08-10 ENCOUNTER — Encounter (HOSPITAL_BASED_OUTPATIENT_CLINIC_OR_DEPARTMENT_OTHER): Payer: Self-pay | Admitting: *Deleted

## 2017-08-10 ENCOUNTER — Ambulatory Visit (HOSPITAL_BASED_OUTPATIENT_CLINIC_OR_DEPARTMENT_OTHER)
Admission: RE | Admit: 2017-08-10 | Discharge: 2017-08-10 | Disposition: A | Discharge status: Home / Self Care | Payer: 59 | Source: Ambulatory Visit | Attending: Orthopedic Surgery | Admitting: Orthopedic Surgery

## 2017-08-10 ENCOUNTER — Ambulatory Visit (HOSPITAL_BASED_OUTPATIENT_CLINIC_OR_DEPARTMENT_OTHER): Payer: 59 | Admitting: Anesthesiology

## 2017-08-10 DIAGNOSIS — Z79899 Other long term (current) drug therapy: Secondary | ICD-10-CM | POA: Diagnosis not present

## 2017-08-10 DIAGNOSIS — M71342 Other bursal cyst, left hand: Secondary | ICD-10-CM | POA: Diagnosis not present

## 2017-08-10 DIAGNOSIS — E0590 Thyrotoxicosis, unspecified without thyrotoxic crisis or storm: Secondary | ICD-10-CM | POA: Diagnosis not present

## 2017-08-10 DIAGNOSIS — E039 Hypothyroidism, unspecified: Secondary | ICD-10-CM | POA: Diagnosis not present

## 2017-08-10 DIAGNOSIS — M19042 Primary osteoarthritis, left hand: Secondary | ICD-10-CM | POA: Diagnosis not present

## 2017-08-10 DIAGNOSIS — M25742 Osteophyte, left hand: Secondary | ICD-10-CM | POA: Diagnosis not present

## 2017-08-10 DIAGNOSIS — M67442 Ganglion, left hand: Secondary | ICD-10-CM | POA: Diagnosis present

## 2017-08-10 DIAGNOSIS — E785 Hyperlipidemia, unspecified: Secondary | ICD-10-CM | POA: Diagnosis not present

## 2017-08-10 DIAGNOSIS — D2112 Benign neoplasm of connective and other soft tissue of left upper limb, including shoulder: Secondary | ICD-10-CM | POA: Insufficient documentation

## 2017-08-10 DIAGNOSIS — Z881 Allergy status to other antibiotic agents status: Secondary | ICD-10-CM | POA: Insufficient documentation

## 2017-08-10 HISTORY — DX: Hyperlipidemia, unspecified: E78.5

## 2017-08-10 HISTORY — PX: MASS EXCISION: SHX2000

## 2017-08-10 HISTORY — DX: Ganglion, unspecified site: M67.40

## 2017-08-10 SURGERY — EXCISION MASS
Anesthesia: Regional | Site: Hand | Laterality: Left

## 2017-08-10 MED ORDER — ONDANSETRON HCL 4 MG/2ML IJ SOLN
INTRAMUSCULAR | Status: AC
  Filled 2017-08-10: qty 2

## 2017-08-10 MED ORDER — SCOPOLAMINE 1 MG/3DAYS TD PT72: 1.0000 | MEDICATED_PATCH | Freq: Once | TRANSDERMAL | Status: DC | PRN

## 2017-08-10 MED ORDER — PROPOFOL 500 MG/50ML IV EMUL
INTRAVENOUS | Status: AC
  Filled 2017-08-10: qty 50

## 2017-08-10 MED ORDER — PROMETHAZINE HCL 25 MG/ML IJ SOLN: 6.2500 mg | INTRAMUSCULAR | Status: DC | PRN

## 2017-08-10 MED ORDER — TRAMADOL HCL 50 MG PO TABS: 50.0000 mg | ORAL_TABLET | Freq: Four times a day (QID) | ORAL | 0 refills | Status: DC | PRN

## 2017-08-10 MED ORDER — FENTANYL CITRATE (PF) 100 MCG/2ML IJ SOLN: 50.0000 ug | INTRAMUSCULAR | Status: DC | PRN

## 2017-08-10 MED ORDER — PROPOFOL 10 MG/ML IV BOLUS
INTRAVENOUS | Status: DC | PRN
  Administered 2017-08-10 (×2): 20 mg via INTRAVENOUS

## 2017-08-10 MED ORDER — FENTANYL CITRATE (PF) 100 MCG/2ML IJ SOLN
INTRAMUSCULAR | Status: AC
Start: 2017-08-10 — End: ?
  Filled 2017-08-10: qty 2

## 2017-08-10 MED ORDER — CHLORHEXIDINE GLUCONATE 4 % EX LIQD: 60.0000 mL | Freq: Once | CUTANEOUS | Status: DC

## 2017-08-10 MED ORDER — CEFAZOLIN SODIUM-DEXTROSE 2-4 GM/100ML-% IV SOLN
2.0000 g | INTRAVENOUS | Status: AC
  Administered 2017-08-10: 2 g via INTRAVENOUS

## 2017-08-10 MED ORDER — LACTATED RINGERS IV SOLN
INTRAVENOUS | Status: DC
  Administered 2017-08-10: 11:00:00 via INTRAVENOUS

## 2017-08-10 MED ORDER — ONDANSETRON HCL 4 MG/2ML IJ SOLN
INTRAMUSCULAR | Status: DC | PRN
  Administered 2017-08-10: 4 mg via INTRAVENOUS

## 2017-08-10 MED ORDER — CEFAZOLIN SODIUM-DEXTROSE 2-4 GM/100ML-% IV SOLN
INTRAVENOUS | Status: AC
  Filled 2017-08-10: qty 100

## 2017-08-10 MED ORDER — FENTANYL CITRATE (PF) 100 MCG/2ML IJ SOLN: 25.0000 ug | INTRAMUSCULAR | Status: DC | PRN

## 2017-08-10 MED ORDER — LIDOCAINE HCL (CARDIAC) PF 100 MG/5ML IV SOSY
PREFILLED_SYRINGE | INTRAVENOUS | Status: AC
  Filled 2017-08-10: qty 5

## 2017-08-10 MED ORDER — LIDOCAINE HCL (PF) 0.5 % IJ SOLN
INTRAMUSCULAR | Status: DC | PRN
Start: 2017-08-10 — End: 2017-08-10
  Administered 2017-08-10: 30 mL via INTRAVENOUS

## 2017-08-10 MED ORDER — MIDAZOLAM HCL 2 MG/2ML IJ SOLN: 1.0000 mg | INTRAMUSCULAR | Status: DC | PRN

## 2017-08-10 MED ORDER — BUPIVACAINE HCL (PF) 0.25 % IJ SOLN
INTRAMUSCULAR | Status: DC | PRN
  Administered 2017-08-10: 7 mL

## 2017-08-10 MED ORDER — SUCCINYLCHOLINE CHLORIDE 200 MG/10ML IV SOSY
PREFILLED_SYRINGE | INTRAVENOUS | Status: AC
  Filled 2017-08-10: qty 10

## 2017-08-10 MED ORDER — MIDAZOLAM HCL 2 MG/2ML IJ SOLN
INTRAMUSCULAR | Status: AC
  Filled 2017-08-10: qty 2

## 2017-08-10 SURGICAL SUPPLY — 42 items
BANDAGE COBAN STERILE 2 (GAUZE/BANDAGES/DRESSINGS) IMPLANT
BLADE SURG 15 STRL LF DISP TIS (BLADE) ×1 IMPLANT
BLADE SURG 15 STRL SS (BLADE) ×2
BNDG CMPR 9X4 STRL LF SNTH (GAUZE/BANDAGES/DRESSINGS)
BNDG COHESIVE 1X5 TAN STRL LF (GAUZE/BANDAGES/DRESSINGS) ×1 IMPLANT
BNDG COHESIVE 3X5 TAN STRL LF (GAUZE/BANDAGES/DRESSINGS) IMPLANT
BNDG ESMARK 4X9 LF (GAUZE/BANDAGES/DRESSINGS) IMPLANT
BNDG GAUZE ELAST 4 BULKY (GAUZE/BANDAGES/DRESSINGS) IMPLANT
CHLORAPREP W/TINT 26ML (MISCELLANEOUS) ×2 IMPLANT
CORD BIPOLAR FORCEPS 12FT (ELECTRODE) ×2 IMPLANT
COVER BACK TABLE 60X90IN (DRAPES) ×2 IMPLANT
COVER MAYO STAND STRL (DRAPES) ×2 IMPLANT
CUFF TOURNIQUET SINGLE 18IN (TOURNIQUET CUFF) ×1 IMPLANT
DECANTER SPIKE VIAL GLASS SM (MISCELLANEOUS) IMPLANT
DRAIN PENROSE 1/2X12 LTX STRL (WOUND CARE) IMPLANT
DRAPE EXTREMITY T 121X128X90 (DRAPE) ×2 IMPLANT
DRAPE SURG 17X23 STRL (DRAPES) ×2 IMPLANT
GAUZE SPONGE 4X4 12PLY STRL (GAUZE/BANDAGES/DRESSINGS) ×2 IMPLANT
GAUZE XEROFORM 1X8 LF (GAUZE/BANDAGES/DRESSINGS) ×2 IMPLANT
GLOVE BIOGEL PI IND STRL 8.5 (GLOVE) ×1 IMPLANT
GLOVE BIOGEL PI INDICATOR 8.5 (GLOVE) ×1
GLOVE SURG ORTHO 8.0 STRL STRW (GLOVE) ×2 IMPLANT
GOWN STRL REUS W/ TWL LRG LVL3 (GOWN DISPOSABLE) ×1 IMPLANT
GOWN STRL REUS W/TWL LRG LVL3 (GOWN DISPOSABLE) ×2
GOWN STRL REUS W/TWL XL LVL3 (GOWN DISPOSABLE) ×2 IMPLANT
NDL PRECISIONGLIDE 27X1.5 (NEEDLE) ×1 IMPLANT
NDL SAFETY ECLIPSE 18X1.5 (NEEDLE) IMPLANT
NEEDLE HYPO 18GX1.5 SHARP (NEEDLE)
NEEDLE PRECISIONGLIDE 27X1.5 (NEEDLE) ×2 IMPLANT
NS IRRIG 1000ML POUR BTL (IV SOLUTION) ×2 IMPLANT
PACK BASIN DAY SURGERY FS (CUSTOM PROCEDURE TRAY) ×2 IMPLANT
PAD CAST 3X4 CTTN HI CHSV (CAST SUPPLIES) IMPLANT
PADDING CAST COTTON 3X4 STRL (CAST SUPPLIES)
SPLINT PLASTER CAST XFAST 3X15 (CAST SUPPLIES) IMPLANT
SPLINT PLASTER XTRA FASTSET 3X (CAST SUPPLIES)
STOCKINETTE 4X48 STRL (DRAPES) ×2 IMPLANT
SUT ETHILON 4 0 PS 2 18 (SUTURE) ×2 IMPLANT
SUT VIC AB 4-0 P2 18 (SUTURE) IMPLANT
SYR BULB 3OZ (MISCELLANEOUS) ×2 IMPLANT
SYR CONTROL 10ML LL (SYRINGE) ×2 IMPLANT
TOWEL GREEN STERILE FF (TOWEL DISPOSABLE) ×2 IMPLANT
UNDERPAD 30X30 (UNDERPADS AND DIAPERS) ×2 IMPLANT

## 2017-08-10 NOTE — H&P (Signed)
Heather Griffin is an 57 y.o. female.   Chief Complaint: mass left index finger QMG:NOIBBCW is a 57 year old right-hand-dominant female referred by Dr. Lestine Mount for consultation regarding a mass in her left index finger. This began in October. She had a deformity to the nail plate was told that this is normal as you age. She states it is gotten progressively larger. The deformity on her nail is increased. She has no history of injury. She states it was not painful initially but she is beginning to have some tenderness especially if she hits the area with a VAS score 5/10. She has not had any treatment for this. She was seen at the dermatology Center and referred for mucoid cyst. She has no history of diabetes thyroid problems arthritis or gout. Family history is positive for each of these. He has a strong family history of rheumatoid arthritis and brother GM and lupus. She has a sister with Bechets. He does have a diagnosis of sjrogens       Past Medical History:  Diagnosis Date  . Hyperlipidemia   . Hyperthyroidism    h/o hyper - 1995  . Infections of kidney    x 2 strep inf of kidney- 1978 and 1982  . Mucoid cyst of joint    left index finger    Past Surgical History:  Procedure Laterality Date  . ABDOMINAL HYSTERECTOMY  05/16/2011   Procedure: HYSTERECTOMY ABDOMINAL;  Surgeon: Allena Katz, MD;  Location: [facility];  Service: Gynecology;  Laterality: N/A;  . KNEE ARTHROTOMY    . SALPINGOOPHORECTOMY  05/16/2011   Procedure: SALPINGO OOPHERECTOMY;  Surgeon: Allena Katz, MD;  Location: [facility];  Service: Gynecology;  Laterality: Bilateral;  . TONSILLECTOMY    . TUBAL LIGATION  1993    History reviewed. No pertinent family history. Social History:  reports that she has never smoked. She has never used smokeless tobacco. She reports that she drinks about 1.2 oz of alcohol per week. She reports that she does not use drugs.  Allergies:  Allergies  Allergen Reactions  . Erythromycin  Nausea And Vomiting    Medications Prior to Admission  Medication Sig Dispense Refill  . atorvastatin (LIPITOR) 20 MG tablet Take 20 mg by mouth daily.    . cholecalciferol (VITAMIN D) 1000 UNITS tablet Take 2,000 Units by mouth every morning.    . ezetimibe (ZETIA) 10 MG tablet Take 10 mg by mouth daily.    Marland Kitchen ibuprofen (ADVIL,MOTRIN) 200 MG tablet Take 600 mg by mouth 2 (two) times daily.      No results found for this or any previous visit (from the past 48 hour(s)).  No results found.   Pertinent items are noted in HPI.  Blood pressure (!) 150/78, pulse 80, temperature 97.9 F (36.6 C), temperature source Oral, resp. rate 18, height 5\' 4"  (1.626 m), weight 63.6 kg (140 lb 3.2 oz), last menstrual period 03/18/2011, SpO2 100 %.  General appearance: alert, cooperative and appears stated age Head: Normocephalic, without obvious abnormality Neck: no JVD Resp: clear to auscultation bilaterally Cardio: regular rate and rhythm, S1, S2 normal, no murmur, click, rub or gallop GI: soft, non-tender; bowel sounds normal; no masses,  no organomegaly Extremities:  mass left index finger Pulses: 2+ and symmetric Skin: Skin color, texture, turgor normal. No rashes or lesions Neurologic: Grossly normal Incision/Wound: na  Assessment/Plan Assessment:  1. Mucoid cyst of joint Left index 2. Osteoarthritis of finger of left hand    Plan:  Discussed the etiology of the problem with her. We have reviewed the notes from dermatology. Would recommend excision of the cyst debridement of the joint. Pre-peri-postoperative course are discussed along with risk complications. She is aware there is no guarantee to the surgery the possibility of infection recurrence injury to arteries nerves tendons complete relief symptoms dystrophy and age-related the possibility of recurrence of the cyst. She is advised the only way that we can guarantee no recurrence is a fusion of the joint which she does not want to  have. She is scheduled for excision of mucoid tumor debridement distal phalangeal joint left index finger as an outpatient under regional anesthesia.      Neftaly Swiss R 08/10/2017, 10:39 AM

## 2017-08-10 NOTE — Transfer of Care (Signed)
Immediate Anesthesia Transfer of Care Note  Patient: Heather Griffin  Procedure(s) Performed: EXCISION MUCOID CYST WITH DISTAL INTERPHALANGEAL JOINT ARTHROTOMY LEFT INDEX FINGER (Left Hand)  Patient Location: PACU  Anesthesia Type:MAC and Bier block  Level of Consciousness: awake, alert  and oriented  Airway & Oxygen Therapy: Patient Spontanous Breathing and Patient connected to face mask oxygen  Post-op Assessment: Report given to RN and Post -op Vital signs reviewed and stable  Post vital signs: Reviewed and stable  Last Vitals:  Vitals Value Taken Time  BP    Temp    Pulse    Resp    SpO2      Last Pain:  Vitals:   08/10/17 1034  TempSrc: Oral  PainSc: 0-No pain      Patients Stated Pain Goal: 0 (03/52/48 1859)  Complications: No apparent anesthesia complications

## 2017-08-10 NOTE — Anesthesia Postprocedure Evaluation (Signed)
Anesthesia Post Note  Patient: Heather Griffin  Procedure(s) Performed: EXCISION MUCOID CYST WITH DISTAL INTERPHALANGEAL JOINT ARTHROTOMY LEFT INDEX FINGER (Left Hand)     Patient location during evaluation: PACU Anesthesia Type: Regional Level of consciousness: awake and alert Pain management: pain level controlled Vital Signs Assessment: post-procedure vital signs reviewed and stable Respiratory status: spontaneous breathing and respiratory function stable Cardiovascular status: stable Postop Assessment: no apparent nausea or vomiting Anesthetic complications: no    Last Vitals:  Vitals:   08/10/17 1255 08/10/17 1332  BP:  (!) 147/73  Pulse: 72 76  Resp: 15 15  Temp:  36.4 C  SpO2: 100% 100%    Last Pain:  Vitals:   08/10/17 1332  TempSrc:   PainSc: 0-No pain                 [name] DANIEL

## 2017-08-10 NOTE — Op Note (Signed)
Preoperative diagnosis: Mucoid cyst degenerative arthritis distal interphalangeal joint left index finger  Postoperative diagnosis: Same  Operation: Excision mucoid cyst debridement distal phalangeal joint osteophytes of the middle phalanx left index finger  Surgeon: Daryll Brod   assistant none  Anesthesia: IV regional with local infiltration IV sedation  Placed surgery: [facility]  History the patient is a 57 year old female with a large mass in the distal aspect of her left index finger dorsally with deformity to the nail plate.  X-rays reveals significant degenerative changes to the distal interphalangeal joint index finger left hand.  She is desirous having this excised along with debridement of the joint.  Pre-peri-and postoperative course been discussed along with risks and complications.  She is aware that there is no guarantee to the surgery the possibility of infection recurrence injury to arteries nerves tendons complete relief symptoms and dystrophy.  Procedure the patient is brought to the operating room where a forearm IV regional anesthetic was carried out without difficulty under the direction the anesthesia department.  She was prepped using ChloraPrep in a supine position with the left arm free.  A timeout was taken to confirm patient procedure.  A metacarpal block was then given to the left index finger.  This was with down with bupivacaine quarter percent plain.  Approximately 8 cc was used.  A curvilinear incision was made over the distal interphalangeal joint of finger the cyst immediately encountered.  This was extremely large.  Crossing both proximally distally to the DIP joint.  Blunt sharp dissection it was dissected free.  It was removed with a hemostatic rondure.  The joint was then opened on its ulnar aspect.  A synovectomy was performed along with removal of osteophytes from the middle phalanx.  The wound was copiously irrigated with saline.  The skin closure  was closed with interrupted 4-0 nylon sutures.  Sterile compressive dressings splint to the fingers applied.  Inflation of the tourniquet remaining fingers pink patient was taken to the recovery room for observation in satisfactory condition.  She will be discharged home to return Kennith Gain agrees were in 1 week on Tylenol and ibuprofen with tramadol as a breakthrough.

## 2017-08-10 NOTE — Discharge Instructions (Addendum)

## 2017-08-10 NOTE — Anesthesia Preprocedure Evaluation (Signed)
Anesthesia Evaluation  Patient identified by MRN, date of birth, ID band Patient awake    Reviewed: Allergy & Precautions, H&P , NPO status , Patient's Chart, lab work & pertinent test results  History of Anesthesia Complications Negative for: history of anesthetic complications  Airway Mallampati: II  TM Distance: >3 FB Neck ROM: full    Dental no notable dental hx. (+) Teeth Intact   Pulmonary neg pulmonary ROS,    Pulmonary exam normal breath sounds clear to auscultation       Cardiovascular negative cardio ROS   Rhythm:regular Rate:Normal     Neuro/Psych negative neurological ROS  negative psych ROS   GI/Hepatic negative GI ROS, Neg liver ROS,   Endo/Other  Hyperthyroidism   Renal/GU negative Renal ROS  negative genitourinary   Musculoskeletal   Abdominal   Peds  Hematology negative hematology ROS (+)   Anesthesia Other Findings   Reproductive/Obstetrics (+) Pregnancy                             Anesthesia Physical  Anesthesia Plan  ASA: II  Anesthesia Plan: General ETT and Regional   Post-op Pain Management:    Induction:   PONV Risk Score and Plan: Ondansetron and Dexamethasone  Airway Management Planned: Natural Airway  Additional Equipment:   Intra-op Plan:   Post-operative Plan:   Informed Consent: I have reviewed the patients History and Physical, chart, labs and discussed the procedure including the risks, benefits and alternatives for the proposed anesthesia with the patient or authorized representative who has indicated his/her understanding and acceptance.   Dental Advisory Given  Plan Discussed with: Anesthesiologist, Surgeon and CRNA  Anesthesia Plan Comments:         Anesthesia Quick Evaluation

## 2017-08-10 NOTE — Brief Op Note (Signed)
08/10/2017  12:28 PM  PATIENT:  Lydia Guiles  57 y.o. female  PRE-OPERATIVE DIAGNOSIS:  MUCOID CYST LEFT INDEX FINGER DISTAL INTERPHALANGEAL JOINT  POST-OPERATIVE DIAGNOSIS:  MUCOID CYST LEFT INDEX FINGER DISTAL   PROCEDURE:  Procedure(s) with comments: EXCISION MUCOID CYST WITH DISTAL INTERPHALANGEAL JOINT ARTHROTOMY LEFT INDEX FINGER (Left) - FAB  SURGEON:  Surgeon(s) and Role:    Daryll Brod, MD - Primary  PHYSICIAN ASSISTANT:   ASSISTANTS: none   ANESTHESIA:   local, regional and IV sedation  EBL:  2 mL   BLOOD ADMINISTERED:none  DRAINS: none   LOCAL MEDICATIONS USED:  BUPIVICAINE   SPECIMEN:  Excision  DISPOSITION OF SPECIMEN:  PATHOLOGY  COUNTS:  YES  TOURNIQUET:   Total Tourniquet Time Documented: Forearm (Left) - 22 minutes Total: Forearm (Left) - 22 minutes   DICTATION: .Dragon Dictation  PLAN OF CARE: Discharge to home after PACU  PATIENT DISPOSITION:  PACU - hemodynamically stable.

## 2017-08-11 ENCOUNTER — Encounter (HOSPITAL_BASED_OUTPATIENT_CLINIC_OR_DEPARTMENT_OTHER): Payer: Self-pay | Admitting: Orthopedic Surgery

## 2017-10-11 DIAGNOSIS — M67442 Ganglion, left hand: Secondary | ICD-10-CM | POA: Diagnosis not present

## 2017-11-30 DIAGNOSIS — E785 Hyperlipidemia, unspecified: Secondary | ICD-10-CM | POA: Diagnosis not present

## 2017-11-30 DIAGNOSIS — Z0000 Encounter for general adult medical examination without abnormal findings: Secondary | ICD-10-CM | POA: Diagnosis not present

## 2017-11-30 DIAGNOSIS — E559 Vitamin D deficiency, unspecified: Secondary | ICD-10-CM | POA: Diagnosis not present

## 2017-12-06 ENCOUNTER — Other Ambulatory Visit: Payer: Self-pay | Admitting: Orthopedic Surgery

## 2017-12-06 DIAGNOSIS — M6740 Ganglion, unspecified site: Secondary | ICD-10-CM | POA: Diagnosis not present

## 2017-12-06 DIAGNOSIS — M19042 Primary osteoarthritis, left hand: Secondary | ICD-10-CM | POA: Diagnosis not present

## 2017-12-06 DIAGNOSIS — Z0000 Encounter for general adult medical examination without abnormal findings: Secondary | ICD-10-CM | POA: Diagnosis not present

## 2017-12-21 ENCOUNTER — Encounter (HOSPITAL_BASED_OUTPATIENT_CLINIC_OR_DEPARTMENT_OTHER): Payer: Self-pay | Admitting: *Deleted

## 2017-12-21 ENCOUNTER — Other Ambulatory Visit: Payer: Self-pay

## 2017-12-27 DIAGNOSIS — Z01419 Encounter for gynecological examination (general) (routine) without abnormal findings: Secondary | ICD-10-CM | POA: Diagnosis not present

## 2017-12-27 DIAGNOSIS — Z6825 Body mass index (BMI) 25.0-25.9, adult: Secondary | ICD-10-CM | POA: Diagnosis not present

## 2017-12-28 ENCOUNTER — Encounter (HOSPITAL_BASED_OUTPATIENT_CLINIC_OR_DEPARTMENT_OTHER): Payer: Self-pay | Admitting: Anesthesiology

## 2017-12-28 ENCOUNTER — Ambulatory Visit (HOSPITAL_BASED_OUTPATIENT_CLINIC_OR_DEPARTMENT_OTHER): Payer: 59 | Admitting: Anesthesiology

## 2017-12-28 ENCOUNTER — Other Ambulatory Visit: Payer: Self-pay

## 2017-12-28 ENCOUNTER — Ambulatory Visit (HOSPITAL_BASED_OUTPATIENT_CLINIC_OR_DEPARTMENT_OTHER)
Admission: RE | Admit: 2017-12-28 | Discharge: 2017-12-28 | Disposition: A | Discharge status: Home / Self Care | Payer: 59 | Source: Ambulatory Visit | Attending: Orthopedic Surgery | Admitting: Orthopedic Surgery

## 2017-12-28 ENCOUNTER — Encounter (HOSPITAL_BASED_OUTPATIENT_CLINIC_OR_DEPARTMENT_OTHER)
Admission: RE | Disposition: A | Discharge status: Home / Self Care | Payer: Self-pay | Source: Ambulatory Visit | Attending: Orthopedic Surgery

## 2017-12-28 DIAGNOSIS — M67844 Other specified disorders of tendon, left hand: Secondary | ICD-10-CM | POA: Diagnosis not present

## 2017-12-28 DIAGNOSIS — M19042 Primary osteoarthritis, left hand: Secondary | ICD-10-CM | POA: Diagnosis present

## 2017-12-28 DIAGNOSIS — E785 Hyperlipidemia, unspecified: Secondary | ICD-10-CM | POA: Diagnosis not present

## 2017-12-28 DIAGNOSIS — M85642 Other cyst of bone, left hand: Secondary | ICD-10-CM | POA: Diagnosis not present

## 2017-12-28 DIAGNOSIS — Z881 Allergy status to other antibiotic agents status: Secondary | ICD-10-CM | POA: Diagnosis not present

## 2017-12-28 DIAGNOSIS — M71342 Other bursal cyst, left hand: Secondary | ICD-10-CM | POA: Diagnosis not present

## 2017-12-28 DIAGNOSIS — M151 Heberden's nodes (with arthropathy): Secondary | ICD-10-CM | POA: Diagnosis not present

## 2017-12-28 HISTORY — PX: DISTAL INTERPHALANGEAL JOINT FUSION: SHX6428

## 2017-12-28 SURGERY — DISTAL INTERPHALANGEAL JOINT FUSION
Anesthesia: Monitor Anesthesia Care | Site: Finger | Laterality: Left

## 2017-12-28 MED ORDER — CEFAZOLIN SODIUM-DEXTROSE 2-4 GM/100ML-% IV SOLN
INTRAVENOUS | Status: AC
  Filled 2017-12-28: qty 100

## 2017-12-28 MED ORDER — PROPOFOL 500 MG/50ML IV EMUL
INTRAVENOUS | Status: AC
  Filled 2017-12-28: qty 50

## 2017-12-28 MED ORDER — HYDROCODONE-ACETAMINOPHEN 5-325 MG PO TABS: 1.0000 | ORAL_TABLET | Freq: Four times a day (QID) | ORAL | 0 refills | Status: DC | PRN

## 2017-12-28 MED ORDER — ONDANSETRON HCL 4 MG/2ML IJ SOLN
INTRAMUSCULAR | Status: DC | PRN
  Administered 2017-12-28: 4 mg via INTRAVENOUS

## 2017-12-28 MED ORDER — METOCLOPRAMIDE HCL 5 MG/ML IJ SOLN: 10.0000 mg | Freq: Once | INTRAMUSCULAR | Status: DC | PRN

## 2017-12-28 MED ORDER — CHLORHEXIDINE GLUCONATE 4 % EX LIQD: 60.0000 mL | Freq: Once | CUTANEOUS | Status: DC

## 2017-12-28 MED ORDER — CEFAZOLIN SODIUM-DEXTROSE 2-4 GM/100ML-% IV SOLN
2.0000 g | INTRAVENOUS | Status: AC
  Administered 2017-12-28: 2 g via INTRAVENOUS

## 2017-12-28 MED ORDER — FENTANYL CITRATE (PF) 100 MCG/2ML IJ SOLN: 25.0000 ug | INTRAMUSCULAR | Status: DC | PRN

## 2017-12-28 MED ORDER — FENTANYL CITRATE (PF) 100 MCG/2ML IJ SOLN
INTRAMUSCULAR | Status: AC
  Filled 2017-12-28: qty 2

## 2017-12-28 MED ORDER — LACTATED RINGERS IV SOLN
INTRAVENOUS | Status: DC
  Administered 2017-12-28: 12:00:00 via INTRAVENOUS

## 2017-12-28 MED ORDER — MEPERIDINE HCL 25 MG/ML IJ SOLN: 6.2500 mg | INTRAMUSCULAR | Status: DC | PRN

## 2017-12-28 MED ORDER — SCOPOLAMINE 1 MG/3DAYS TD PT72: 1.0000 | MEDICATED_PATCH | Freq: Once | TRANSDERMAL | Status: DC | PRN

## 2017-12-28 MED ORDER — FENTANYL CITRATE (PF) 100 MCG/2ML IJ SOLN
50.0000 ug | INTRAMUSCULAR | Status: DC | PRN
  Administered 2017-12-28: 100 ug via INTRAVENOUS

## 2017-12-28 MED ORDER — PROPOFOL 500 MG/50ML IV EMUL
INTRAVENOUS | Status: DC | PRN
  Administered 2017-12-28: 75 ug/kg/min via INTRAVENOUS

## 2017-12-28 MED ORDER — LACTATED RINGERS IV SOLN: INTRAVENOUS | Status: DC

## 2017-12-28 MED ORDER — MIDAZOLAM HCL 2 MG/2ML IJ SOLN
INTRAMUSCULAR | Status: AC
  Filled 2017-12-28: qty 2

## 2017-12-28 MED ORDER — MIDAZOLAM HCL 2 MG/2ML IJ SOLN
1.0000 mg | INTRAMUSCULAR | Status: DC | PRN
  Administered 2017-12-28: 1 mg via INTRAVENOUS
  Administered 2017-12-28: 2 mg via INTRAVENOUS
  Administered 2017-12-28: 1 mg via INTRAVENOUS

## 2017-12-28 MED ORDER — ROPIVACAINE HCL 7.5 MG/ML IJ SOLN
INTRAMUSCULAR | Status: DC | PRN
  Administered 2017-12-28: 20 mL via PERINEURAL

## 2017-12-28 SURGICAL SUPPLY — 64 items
BIT DRILL 1.7 (BIT) ×2
BLADE MINI RND TIP GREEN BEAV (BLADE) ×2 IMPLANT
BLADE SURG 15 STRL LF DISP TIS (BLADE) ×1 IMPLANT
BLADE SURG 15 STRL SS (BLADE) ×2
BNDG CMPR 9X4 STRL LF SNTH (GAUZE/BANDAGES/DRESSINGS) ×1
BNDG COHESIVE 1X5 TAN STRL LF (GAUZE/BANDAGES/DRESSINGS) ×2 IMPLANT
BNDG COHESIVE 3X5 TAN STRL LF (GAUZE/BANDAGES/DRESSINGS) ×1 IMPLANT
BNDG ESMARK 4X9 LF (GAUZE/BANDAGES/DRESSINGS) ×2 IMPLANT
BNDG GAUZE ELAST 4 BULKY (GAUZE/BANDAGES/DRESSINGS) ×1 IMPLANT
CHLORAPREP W/TINT 26ML (MISCELLANEOUS) ×2 IMPLANT
CORD BIPOLAR FORCEPS 12FT (ELECTRODE) ×2 IMPLANT
COVER BACK TABLE 60X90IN (DRAPES) ×2 IMPLANT
COVER MAYO STAND STRL (DRAPES) ×2 IMPLANT
COVER WAND RF STERILE (DRAPES) IMPLANT
CUFF TOURNIQUET SINGLE 18IN (TOURNIQUET CUFF) ×1 IMPLANT
DRAPE EXTREMITY T 121X128X90 (DRAPE) ×2 IMPLANT
DRAPE OEC MINIVIEW 54X84 (DRAPES) ×2 IMPLANT
DRAPE SURG 17X23 STRL (DRAPES) ×2 IMPLANT
DRILL SRG LNG 1.7X2XQR (BIT) IMPLANT
GAUZE SPONGE 4X4 12PLY STRL (GAUZE/BANDAGES/DRESSINGS) ×2 IMPLANT
GAUZE XEROFORM 1X8 LF (GAUZE/BANDAGES/DRESSINGS) ×2 IMPLANT
GLOVE BIO SURGEON STRL SZ7.5 (GLOVE) ×1 IMPLANT
GLOVE BIOGEL PI IND STRL 7.0 (GLOVE) IMPLANT
GLOVE BIOGEL PI IND STRL 8 (GLOVE) IMPLANT
GLOVE BIOGEL PI IND STRL 8.5 (GLOVE) ×1 IMPLANT
GLOVE BIOGEL PI INDICATOR 7.0 (GLOVE) ×1
GLOVE BIOGEL PI INDICATOR 8 (GLOVE) ×1
GLOVE BIOGEL PI INDICATOR 8.5 (GLOVE) ×1
GLOVE EXAM NITRILE MD LF STRL (GLOVE) ×1 IMPLANT
GLOVE SURG ORTHO 8.0 STRL STRW (GLOVE) ×2 IMPLANT
GLOVE SURG SYN 7.5  E (GLOVE) ×1
GLOVE SURG SYN 7.5 E (GLOVE) ×1 IMPLANT
GLOVE SURG SYN 7.5 PF PI (GLOVE) IMPLANT
GOWN STRL REUS W/ TWL LRG LVL3 (GOWN DISPOSABLE) ×1 IMPLANT
GOWN STRL REUS W/ TWL XL LVL3 (GOWN DISPOSABLE) IMPLANT
GOWN STRL REUS W/TWL LRG LVL3 (GOWN DISPOSABLE) ×2
GOWN STRL REUS W/TWL XL LVL3 (GOWN DISPOSABLE) ×5 IMPLANT
K-WIRE .035 (WIRE) ×2
KWIRE .035 (WIRE) ×1 IMPLANT
KWIRE FIXATION L4 .035 (WIRE) IMPLANT
NDL PRECISIONGLIDE 27X1.5 (NEEDLE) IMPLANT
NEEDLE PRECISIONGLIDE 27X1.5 (NEEDLE) IMPLANT
NS IRRIG 1000ML POUR BTL (IV SOLUTION) ×2 IMPLANT
PACK BASIN DAY SURGERY FS (CUSTOM PROCEDURE TRAY) ×2 IMPLANT
PAD CAST 3X4 CTTN HI CHSV (CAST SUPPLIES) ×1 IMPLANT
PADDING CAST ABS 3INX4YD NS (CAST SUPPLIES)
PADDING CAST ABS 4INX4YD NS (CAST SUPPLIES)
PADDING CAST ABS COTTON 3X4 (CAST SUPPLIES) IMPLANT
PADDING CAST ABS COTTON 4X4 ST (CAST SUPPLIES) ×1 IMPLANT
PADDING CAST COTTON 3X4 STRL (CAST SUPPLIES)
SCREW 2.0X24MM (Screw) ×1 IMPLANT
SLEEVE SCD COMPRESS KNEE MED (MISCELLANEOUS) IMPLANT
SLING ARM FOAM STRAP MED (SOFTGOODS) ×1 IMPLANT
SPLINT FINGER 3.25 BULB 911905 (SOFTGOODS) ×1 IMPLANT
SPLINT PLASTER CAST XFAST 3X15 (CAST SUPPLIES) ×10 IMPLANT
SPLINT PLASTER XTRA FASTSET 3X (CAST SUPPLIES) ×10
STOCKINETTE 4X48 STRL (DRAPES) ×2 IMPLANT
SUT ETHILON 4 0 PS 2 18 (SUTURE) ×2 IMPLANT
SUT ETHILON 5 0 PS 2 18 (SUTURE) ×1 IMPLANT
SUT VICRYL 4-0 PS2 18IN ABS (SUTURE) ×2 IMPLANT
SYR BULB 3OZ (MISCELLANEOUS) ×2 IMPLANT
SYR CONTROL 10ML LL (SYRINGE) IMPLANT
TOWEL GREEN STERILE FF (TOWEL DISPOSABLE) ×2 IMPLANT
UNDERPAD 30X30 (UNDERPADS AND DIAPERS) ×1 IMPLANT

## 2017-12-28 NOTE — Discharge Instructions (Addendum)

## 2017-12-28 NOTE — Anesthesia Postprocedure Evaluation (Signed)
Anesthesia Post Note  Patient: Heather Griffin  Procedure(s) Performed: FUSION DISTAL INTERPHALANGEAL JOINT  LEFT INDEX AND EXCISION MUOID CYST (Left Finger)     Patient location during evaluation: PACU Anesthesia Type: Regional and MAC Level of consciousness: awake and alert Pain management: pain level controlled Vital Signs Assessment: post-procedure vital signs reviewed and stable Respiratory status: spontaneous breathing, nonlabored ventilation, respiratory function stable and patient connected to nasal cannula oxygen Cardiovascular status: stable and blood pressure returned to baseline Postop Assessment: no apparent nausea or vomiting Anesthetic complications: no    Last Vitals:  Vitals:   12/28/17 1345 12/28/17 1413  BP: (!) 136/97 (!) 160/82  Pulse: 81 78  Resp: 18 18  Temp:  36.6 C  SpO2: 95% 95%    Last Pain:  Vitals:   12/28/17 1413  TempSrc:   PainSc: 0-No pain                 Montez Hageman

## 2017-12-28 NOTE — Op Note (Signed)
NAME: Heather Griffin MEDICAL RECORD NO: [id number] DATE OF BIRTH: [birth year] F[facility] LOCATION: [facility] PHYSICIAN: Wynonia Sours, MD   OPERATIVE REPORT   DATE OF PROCEDURE: 12/28/17    PREOPERATIVE DIAGNOSIS:   Degenerative arthritis distal interphalangeal joint left index finger with mucoid cyst   POSTOPERATIVE DIAGNOSIS:   Same   PROCEDURE:   Excision cyst fusion distal interphalangeal joint left index finger with OsteoMed headless screw   SURGEON: Daryll Brod, M.D.   ASSISTANT: Leanora Cover, MD   ANESTHESIA:  Regional with sedation   INTRAVENOUS FLUIDS:  Per anesthesia flow sheet.   ESTIMATED BLOOD LOSS:  Minimal.   COMPLICATIONS:  None.   SPECIMENS:  none   TOURNIQUET TIME:    Total Tourniquet Time Documented: Upper Arm (Left) - 30 minutes Total: Upper Arm (Left) - 30 minutes    DISPOSITION:  Stable to PACU.   INDICATIONS: Patient is a 57 year old female with a history of severe arthritis of the distal interphalangeal joint of left index finger she has undergone excision of a mucoid cyst with debridement of the joint in the past she has however had continued recurrence of the cyst continued pain with deviation of the fingertip area she has elected to have excision of the cyst with fusion of the joint and effort to prevent recurrence.  He is aware that there is no guarantee to the surgery the possibility of infection recurrence injury to arteries nerves tendons and complete relief of symptoms and dystrophy.  In the preoperative area the patient seen extremity marked by both patient and surgeon antibiotic given  OPERATIVE COURSE: She is brought to the operating room after a supraclavicular block was carried out without difficulty in the preoperative area under the auspices of the anesthesia department.  She was prepped using ChloraPrep supine position left arm free.  A three-minute dry time was allowed timeout taken to confirm patient procedure.   The limb was exsanguinated with an Esmarch bandage turn placed on the arm was inflated to 250 mmHg.  The old incision was used converted to [facility] over the distal phalangeal joint carried down through subcutaneous tissue.  Bleeders were electrocauterized necessary bipolar.  The extensor tendon was cut proximally.  The skin undermined distally to the cyst which was excised with a hemostatic rondure.  The joint was then further debrided the articular surface removed from both the middle and distal phalanges using rongeurs.  This was shaped as much as possible to except a OsteoMed screw with relieving the angulatory deformity.  A guidepin was then placed in an antegrade manner through the distal phalanx confirmed in position with image intensification and then a drill hole placed proximally for acceptance of the screw in the central aspect of the middle phalanx.  The guide pin was then reversed and inserted checked with image intensification find the found lying good position both distal and middle phalanges.  A 24 mm OsteoMed headless screw was then inserted firmly compressing the distal phalanx.  The x-rays confirmed positioning of the screw.  There was no violation of the nail bed.  The wound was copiously irrigated with saline.  The skin was closed interrupted 5-0 nylon sutures.  The incision for application of the screws to the tip of the distal phalanx was allowed to heal without closure.  A sterile compressive dressing and splint to the finger applied.  Inflation of the tourniquet remaining fingers pink.  She was taken to the recovery room for observation in satisfactory condition.  She will be discharged home to return to [facility] in 1 week on Tylenol with ibuprofen and Norco for breakthrough.   Daryll Brod, MD Electronically signed, 12/28/17

## 2017-12-28 NOTE — Anesthesia Procedure Notes (Signed)
Anesthesia Regional Block: Supraclavicular block   Pre-Anesthetic Checklist: ,, timeout performed, Correct Patient, Correct Site, Correct Laterality, Correct Procedure, Correct Position, site marked, Risks and benefits discussed,  Surgical consent,  Pre-op evaluation,  At surgeon's request and post-op pain management  Laterality: Left and Upper  Prep: Maximum Sterile Barrier Precautions used, chloraprep       Needles:  Injection technique: Single-shot  Needle Type: Echogenic Stimulator Needle     Needle Length: 10cm      Additional Needles:   Procedures:,,,, ultrasound used (permanent image in chart),,,,  Narrative:  Start time: 12/28/2017 11:55 AM End time: 12/28/2017 12:00 PM Injection made incrementally with aspirations every 5 mL.  Performed by: Personally  Anesthesiologist: Montez Hageman, MD  Additional Notes: Risks, benefits and alternative to block explained extensively.  Patient tolerated procedure well, without complications.

## 2017-12-28 NOTE — Anesthesia Preprocedure Evaluation (Signed)
Anesthesia Evaluation  Patient identified by MRN, date of birth, ID band Patient awake    Reviewed: Allergy & Precautions, NPO status , Patient's Chart, lab work & pertinent test results  Airway Mallampati: II  TM Distance: >3 FB Neck ROM: Full    Dental no notable dental hx.    Pulmonary neg pulmonary ROS,    Pulmonary exam normal breath sounds clear to auscultation       Cardiovascular negative cardio ROS Normal cardiovascular exam Rhythm:Regular Rate:Normal     Neuro/Psych negative neurological ROS  negative psych ROS   GI/Hepatic negative GI ROS, Neg liver ROS,   Endo/Other  negative endocrine ROS  Renal/GU negative Renal ROS  negative genitourinary   Musculoskeletal negative musculoskeletal ROS (+)   Abdominal   Peds negative pediatric ROS (+)  Hematology negative hematology ROS (+)   Anesthesia Other Findings   Reproductive/Obstetrics negative OB ROS                             Anesthesia Physical Anesthesia Plan  ASA: I  Anesthesia Plan: Regional and MAC   Post-op Pain Management:    Induction:   PONV Risk Score and Plan: 2 and Treatment may vary due to age or medical condition  Airway Management Planned: Nasal Cannula and Simple Face Mask  Additional Equipment:   Intra-op Plan:   Post-operative Plan:   Informed Consent: I have reviewed the patients History and Physical, chart, labs and discussed the procedure including the risks, benefits and alternatives for the proposed anesthesia with the patient or authorized representative who has indicated his/her understanding and acceptance.   Dental advisory given  Plan Discussed with:   Anesthesia Plan Comments:         Anesthesia Quick Evaluation

## 2017-12-28 NOTE — Progress Notes (Signed)
Assisted Dr. Carignan with left, ultrasound guided, supraclavicular block. Side rails up, monitors on throughout procedure. See vital signs in flow sheet. Tolerated Procedure well. 

## 2017-12-28 NOTE — H&P (Signed)
Heather Griffin is an 57 y.o. female.   Chief Complaint: arthritis left index finger distal jointHPI: Heather Griffin is a 57 year old right-hand-dominant female referred by Dr. Lestine Mount for consultation regarding a mass in her left index finger. This began in October. She had a deformity to the nail plate was told that this is normal as you age. She states it is gotten progressively larger. The deformity on her nail is increased. She has no history of injury. She states it was not painful initially but she is beginning to have some tenderness especially if she hits the area with a VAS score 5/10. She has not had any treatment for this. She was seen at the dermatology Center and referred for mucoid cyst. She has no history of diabetes thyroid problems arthritis or gout. Family history is positive for each of these. He has a strong family history of rheumatoid arthritis and brother GM and lupus. She has a sister with Bechets   Past Medical History:  Diagnosis Date  . Hyperlipidemia   . Hyperthyroidism    h/o hyper - 1995  . Infections of kidney    x 2 strep inf of kidney- 1978 and 1982  . Mucoid cyst of joint    left index finger    Past Surgical History:  Procedure Laterality Date  . ABDOMINAL HYSTERECTOMY  05/16/2011   Procedure: HYSTERECTOMY ABDOMINAL;  Surgeon: Allena Katz, MD;  Location: [facility];  Service: Gynecology;  Laterality: N/A;  . KNEE ARTHROTOMY    . MASS EXCISION Left 08/10/2017   Procedure: EXCISION MUCOID CYST WITH DISTAL INTERPHALANGEAL JOINT ARTHROTOMY LEFT INDEX FINGER;  Surgeon: Daryll Brod, MD;  Location: [facility];  Service: Orthopedics;  Laterality: Left;  FAB  . SALPINGOOPHORECTOMY  05/16/2011   Procedure: SALPINGO OOPHERECTOMY;  Surgeon: Allena Katz, MD;  Location: [facility];  Service: Gynecology;  Laterality: Bilateral;  . TONSILLECTOMY    . TUBAL LIGATION  1993    History reviewed. No pertinent family history. Social History:  reports that she has  never smoked. She has never used smokeless tobacco. She reports that she drinks about 2.0 standard drinks of alcohol per week. She reports that she does not use drugs.  Allergies:  Allergies  Allergen Reactions  . Erythromycin Nausea And Vomiting    No medications prior to admission.    No results found for this or any previous visit (from the past 48 hour(s)).  No results found.   Pertinent items are noted in HPI.  Height 5\' 4"  (1.626 m), weight 63.5 kg, last menstrual period 03/18/2011.  General appearance: alert, cooperative and appears stated age Head: Normocephalic, without obvious abnormality Neck: no JVD Resp: clear to auscultation bilaterally Cardio: regular rate and rhythm, S1, S2 normal, no murmur, click, rub or gallop GI: soft, non-tender; bowel sounds normal; no masses,  no organomegaly Extremities: arthritis left index finger Pulses: 2+ and symmetric Skin: Skin color, texture, turgor normal. No rashes or lesions Neurologic: Grossly normal Incision/Wound: na  Assessment/Plan Assessment:  1. Mucoid cyst of joint  Left index 2. Osteoarthritis of finger of left hand    Plan: Discussed the etiology of the problem with her. We have reviewed the notes from dermatology. Would recommend excision of the cyst debridement of the joint. Pre-peri-postoperative course are discussed along with risk complications. She is aware there is no guarantee to the surgery the possibility of infection recurrence injury to arteries nerves tendons complete relief symptoms dystrophy and age-related the possibility of  recurrence of the cyst. She is advised the only way that we can guarantee no recurrence is a fusion of the joint which she does not want to have. She is scheduled for excision of mucoid tumor debridement distal phalangeal joint left index finger as an outpatient under regional anesthesia.   Daryll Brod 12/28/2017, 8:21 AM

## 2017-12-28 NOTE — Transfer of Care (Signed)
Immediate Anesthesia Transfer of Care Note  Patient: Heather Griffin  Procedure(s) Performed: FUSION DISTAL INTERPHALANGEAL JOINT  LEFT INDEX AND EXCISION MUOID CYST (Left Finger)  Patient Location: PACU  Anesthesia Type:MAC combined with regional for post-op pain  Level of Consciousness: sedated  Airway & Oxygen Therapy: Patient Spontanous Breathing and Patient connected to face mask oxygen  Post-op Assessment: Report given to RN and Post -op Vital signs reviewed and stable  Post vital signs: Reviewed and stable  Last Vitals:  Vitals Value Taken Time  BP 130/70 12/28/2017  1:34 PM  Temp    Pulse 84 12/28/2017  1:33 PM  Resp 12 12/28/2017  1:34 PM  SpO2 98 % 12/28/2017  1:33 PM  Vitals shown include unvalidated device data.  Last Pain:  Vitals:   12/28/17 1134  TempSrc: Oral  PainSc: 0-No pain         Complications: No apparent anesthesia complications

## 2017-12-28 NOTE — Brief Op Note (Signed)
12/28/2017  1:33 PM  PATIENT:  Heather Griffin  57 y.o. female  PRE-OPERATIVE DIAGNOSIS:  MUCOID CYST LEFT INDEX, DEGENERATIVE JOINT DISEASE DISTAL INTERPHALANGEAL JOINT  POST-OPERATIVE DIAGNOSIS:  MUCOID CYST LEFT INDEX, DEGENERATIVE JOINT DISEASE DISTAL INTERPHALANGEAL JOINT  PROCEDURE:  Procedure(s) with comments: FUSION DISTAL INTERPHALANGEAL JOINT  LEFT INDEX AND EXCISION MUOID CYST (Left) - AXILLARY BLOCK  SURGEON:  Surgeon(s) and Role:    * Daryll Brod, MD - Primary    * Leanora Cover, MD - Assisting  PHYSICIAN ASSISTANT:   ASSISTANTS: K Aikeem Lilley,MD   ANESTHESIA:   regional and IV sedation  EBL:  64ml  BLOOD ADMINISTERED:none  DRAINS: none   LOCAL MEDICATIONS USED:  NONE  SPECIMEN:  No Specimen  DISPOSITION OF SPECIMEN:  N/A  COUNTS:  YES  TOURNIQUET:   Total Tourniquet Time Documented: Upper Arm (Left) - 30 minutes Total: Upper Arm (Left) - 30 minutes   DICTATION: .Dragon Dictation  PLAN OF CARE: Discharge to home after PACU  PATIENT DISPOSITION:  PACU - hemodynamically stable.

## 2017-12-28 NOTE — Op Note (Signed)
I assisted Surgeon(s) and Role:    * Daryll Brod, MD - Primary    * Leanora Cover, MD - Assisting on the Procedure(s): FUSION DISTAL INTERPHALANGEAL JOINT  LEFT INDEX AND EXCISION MUOID CYST on 12/28/2017.  I provided assistance on this case as follows: positioning of digit, passage of guide wire and screw.  Electronically signed by: Leanora Cover, MD Date: 12/28/2017 Time: 1:29 PM

## 2017-12-29 ENCOUNTER — Encounter (HOSPITAL_BASED_OUTPATIENT_CLINIC_OR_DEPARTMENT_OTHER): Payer: Self-pay | Admitting: Orthopedic Surgery

## 2018-01-03 DIAGNOSIS — M79645 Pain in left finger(s): Secondary | ICD-10-CM | POA: Diagnosis not present

## 2018-01-03 DIAGNOSIS — M19042 Primary osteoarthritis, left hand: Secondary | ICD-10-CM | POA: Diagnosis not present

## 2018-01-03 DIAGNOSIS — M6740 Ganglion, unspecified site: Secondary | ICD-10-CM | POA: Diagnosis not present

## 2018-01-11 DIAGNOSIS — H25813 Combined forms of age-related cataract, bilateral: Secondary | ICD-10-CM | POA: Diagnosis not present

## 2018-02-07 DIAGNOSIS — M19042 Primary osteoarthritis, left hand: Secondary | ICD-10-CM | POA: Diagnosis not present

## 2018-02-07 DIAGNOSIS — M6740 Ganglion, unspecified site: Secondary | ICD-10-CM | POA: Diagnosis not present

## 2018-02-26 DIAGNOSIS — M6740 Ganglion, unspecified site: Secondary | ICD-10-CM | POA: Diagnosis not present

## 2018-02-26 DIAGNOSIS — M19042 Primary osteoarthritis, left hand: Secondary | ICD-10-CM | POA: Diagnosis not present

## 2018-03-28 DIAGNOSIS — M19042 Primary osteoarthritis, left hand: Secondary | ICD-10-CM | POA: Diagnosis not present

## 2018-11-14 ENCOUNTER — Other Ambulatory Visit: Payer: Self-pay

## 2018-11-14 DIAGNOSIS — Z20822 Contact with and (suspected) exposure to covid-19: Secondary | ICD-10-CM

## 2018-11-15 LAB — NOVEL CORONAVIRUS, NAA: SARS-CoV-2, NAA: NOT DETECTED

## 2019-04-04 DIAGNOSIS — E7800 Pure hypercholesterolemia, unspecified: Secondary | ICD-10-CM | POA: Insufficient documentation

## 2020-10-27 ENCOUNTER — Ambulatory Visit (INDEPENDENT_AMBULATORY_CARE_PROVIDER_SITE_OTHER): Payer: 59 | Admitting: Podiatry

## 2020-10-27 ENCOUNTER — Encounter: Payer: Self-pay | Admitting: Podiatry

## 2020-10-27 ENCOUNTER — Ambulatory Visit (INDEPENDENT_AMBULATORY_CARE_PROVIDER_SITE_OTHER): Payer: 59

## 2020-10-27 ENCOUNTER — Other Ambulatory Visit: Payer: Self-pay

## 2020-10-27 DIAGNOSIS — M79671 Pain in right foot: Secondary | ICD-10-CM | POA: Diagnosis not present

## 2020-10-27 DIAGNOSIS — S99921A Unspecified injury of right foot, initial encounter: Secondary | ICD-10-CM

## 2020-10-27 DIAGNOSIS — S99921S Unspecified injury of right foot, sequela: Secondary | ICD-10-CM

## 2020-10-27 DIAGNOSIS — S92411A Displaced fracture of proximal phalanx of right great toe, initial encounter for closed fracture: Secondary | ICD-10-CM

## 2020-10-28 NOTE — Progress Notes (Signed)
  Subjective:  Patient ID: Heather Griffin, female    DOB: Apr 17, 1960,  MRN: [id number]  Chief Complaint  Patient presents with   Toe Injury      (xray)Right big toe - jammed it in May and it continues to hurt and seems swollen; limited flexion    60 y.o. female presents with the above complaint. History confirmed with patient.  Says she missed a step back in May and injured the toe thinks it was forcibly plantarflexed and axially loaded.  It still hurts mostly in the big toe joint.  Has been using ice ibuprofen, was never in a boot or sought care for before this Objective:  Physical Exam: warm, good capillary refill, no trophic changes or ulcerative lesions, normal DP and PT pulses, and normal sensory exam. Left Foot: normal exam, no swelling, tenderness, instability; ligaments intact, full range of motion of all ankle/foot joints Right Foot: Pain and edema around the first MTPJ and IPJ, she has pain with forceful plantarflexion of the IPJ with crepitance  No images are attached to the encounter.  Radiographs: Multiple views x-ray of the right foot: She has intra-articular oblique minimally displaced fracture of the proximal phalanx that appears to have been healing well although there is a slight step-off at the IPJ articular surface Assessment:   1. Toe injury, right, sequela   2. Closed displaced fracture of proximal phalanx of right great toe, initial encounter   3. Toe injury, right, initial encounter      Plan:  Patient was evaluated and treated and all questions answered.  Reviewed the radiographs with the patient in detail.  I discussed with her she likely has beginning to experience joint pain and inflammation from the fracture and early signs of arthritis.  I recommended supporting with immobilization, I think a boot or cast would be unnecessary at this point I recommended supporting with a stiff shoe and a carbon fiber turf toe plate.  We discussed the likelihood of arthritis  forming in the joint and need for arthrodesis at some point if this happens.  Recommend we let her rest for 6 to 8 weeks I reviewed RICE protocol for when it is really hurting, if still painful at next visit would consider corticosteroid injection for symptomatic relief.  If not improving thereafter would consider MRI.  Return in about 8 weeks (around 12/22/2020) for recheck toe fracture .

## 2020-12-22 ENCOUNTER — Ambulatory Visit: Payer: 59 | Admitting: Podiatry

## 2021-09-05 ENCOUNTER — Telehealth: Payer: Self-pay

## 2021-09-05 ENCOUNTER — Other Ambulatory Visit: Payer: Self-pay

## 2021-09-05 ENCOUNTER — Encounter: Payer: Self-pay | Admitting: Emergency Medicine

## 2021-09-05 ENCOUNTER — Ambulatory Visit
Admission: EM | Admit: 2021-09-05 | Discharge: 2021-09-05 | Disposition: A | Discharge status: Home / Self Care | Payer: 59 | Attending: Physician Assistant | Admitting: Physician Assistant

## 2021-09-05 DIAGNOSIS — W540XXA Bitten by dog, initial encounter: Secondary | ICD-10-CM

## 2021-09-05 DIAGNOSIS — S81851A Open bite, right lower leg, initial encounter: Secondary | ICD-10-CM | POA: Diagnosis not present

## 2021-09-05 DIAGNOSIS — L0390 Cellulitis, unspecified: Secondary | ICD-10-CM | POA: Diagnosis not present

## 2021-09-05 MED ORDER — AMOXICILLIN-POT CLAVULANATE 875-125 MG PO TABS: 1.0000 | ORAL_TABLET | Freq: Two times a day (BID) | ORAL | 0 refills | Status: DC

## 2021-09-05 NOTE — ED Triage Notes (Signed)
Pt here for wound from her dog to back of right leg with pain and redness

## 2021-09-05 NOTE — Discharge Instructions (Addendum)
Return if any problems.

## 2021-09-06 NOTE — ED Provider Notes (Signed)
[facility]    CSN: [id number] Arrival date & time: 09/05/21  0818      History   Chief Complaint Chief Complaint  Patient presents with   Wound Check    HPI Heather Griffin is a 61 y.o. female.   Pt reports her dog bit the back of her leg.  Pt concerned that area is infected. Pt has drainage and swelling.  No fever or chills  The history is provided by the patient. No language interpreter was used.  Wound Check This is a new problem. The current episode started 2 days ago. The problem has been gradually worsening. Nothing aggravates the symptoms. Nothing relieves the symptoms. She has tried nothing for the symptoms.    Past Medical History:  Diagnosis Date   Hyperlipidemia    Hyperthyroidism    h/o hyper - 1995   Infections of kidney    x 2 strep inf of kidney- 1978 and 1982   Mucoid cyst of joint    left index finger    Patient Active Problem List   Diagnosis Date Noted   Hypercholesterolemia 04/04/2019   Mucoid cyst of joint 07/31/2017   Osteoarthritis of finger of left hand 07/31/2017    Past Surgical History:  Procedure Laterality Date   ABDOMINAL HYSTERECTOMY  05/16/2011   Procedure: HYSTERECTOMY ABDOMINAL;  Surgeon: Allena Katz, MD;  Location: [facility];  Service: Gynecology;  Laterality: N/A;   DISTAL INTERPHALANGEAL JOINT FUSION Left 12/28/2017   Procedure: FUSION DISTAL INTERPHALANGEAL JOINT  LEFT INDEX AND EXCISION MUCOID CYST;  Surgeon: Daryll Brod, MD;  Location: [place];  Service: Orthopedics;  Laterality: Left;  AXILLARY BLOCK   KNEE ARTHROTOMY     MASS EXCISION Left 08/10/2017   Procedure: EXCISION MUCOID CYST WITH DISTAL INTERPHALANGEAL JOINT ARTHROTOMY LEFT INDEX FINGER;  Surgeon: Daryll Brod, MD;  Location: [place];  Service: Orthopedics;  Laterality: Left;  FAB   SALPINGOOPHORECTOMY  05/16/2011   Procedure: SALPINGO OOPHERECTOMY;  Surgeon: Allena Katz, MD;  Location: [facility];  Service:  Gynecology;  Laterality: Bilateral;   TONSILLECTOMY     TUBAL LIGATION  1993    OB History   No obstetric history on file.      Home Medications    Prior to Admission medications   Medication Sig Start Date End Date Taking? Authorizing Provider  amoxicillin-clavulanate (AUGMENTIN) 875-125 MG tablet Take 1 tablet by mouth 2 (two) times daily. 09/05/21   Fransico Meadow, PA-C  atorvastatin (LIPITOR) 20 MG tablet Take 20 mg by mouth daily.    [provider]  atorvastatin (LIPITOR) 20 MG tablet atorvastatin 20 mg tablet   1 tablet every day by oral route. 05/07/17   [provider]  Besifloxacin HCl (BESIVANCE) 0.6 % SUSP Besivance 0.6 % eye drops,suspension  STARTING 3 DAYS PRIOR TO SURGERY INSTILL 1 DROP 3 TIMES PER DAY INTO OPERATIVE EYE AS DIRECTED    [provider]  Bromfenac Sodium (PROLENSA) 0.07 % SOLN Prolensa 0.07 % eye drops  STARTING 3 DAYS PRIOR TO SURGERY INSTILL 1 DROP INTO OPERATIVE EYE TWICE DAILY at bedtime AS DIRECTED    [provider]  cholecalciferol (VITAMIN D) 1000 UNITS tablet Take 2,000 Units by mouth every morning.    [provider]  Difluprednate (DUREZOL) 0.05 % EMUL Durezol 0.05 % eye drops  INSTILL 1 DROP INTO OPERATIVE EYE THREE TIMES DAILY STARTING 3 DAYS PRIOR TO SURGERY AND CONTINUE FOR 1 WEEK  AFTER SURGERY, THEN 2 TIMES A D    [provider]  ezetimibe (ZETIA) 10 MG tablet Take 10 mg by mouth daily.    [provider]  HYDROcodone-acetaminophen (NORCO) 5-325 MG tablet Take 1 tablet by mouth every 6 (six) hours as needed. 12/28/17   Daryll Brod, MD  ibuprofen (ADVIL) 200 MG tablet Take by mouth.    [provider]  ibuprofen (ADVIL,MOTRIN) 200 MG tablet Take 600 mg by mouth 2 (two) times daily.    [provider]    Family History History reviewed. No pertinent family history.  Social History Social History   Tobacco Use   Smoking status: Never   Smokeless tobacco:  Never  Vaping Use   Vaping Use: Never used  Substance Use Topics   Alcohol use: Yes    Alcohol/week: 2.0 standard drinks of alcohol    Types: 2 Glasses of wine per week    Comment: social   Drug use: No     Allergies   Erythromycin   Review of Systems Review of Systems  Skin:  Positive for wound.  All other systems reviewed and are negative.    Physical Exam Triage Vital Signs ED Triage Vitals  Enc Vitals Group     BP 09/05/21 0847 (!) 168/85     Pulse Rate 09/05/21 0847 86     Resp 09/05/21 0847 18     Temp 09/05/21 0847 98.6 F (37 C)     Temp Source 09/05/21 0847 Oral     SpO2 09/05/21 0847 99 %     Weight --      Height --      Head Circumference --      Peak Flow --      Pain Score 09/05/21 0848 5     Pain Loc --      Pain Edu? --      Excl. in [place]? --    No data found.  Updated Vital Signs BP (!) 168/85 (BP Location: Left Arm)   Pulse 86   Temp 98.6 F (37 C) (Oral)   Resp 18   LMP 03/18/2011   SpO2 99%   Visual Acuity Right Eye Distance:   Left Eye Distance:   Bilateral Distance:    Right Eye Near:   Left Eye Near:    Bilateral Near:     Physical Exam Vitals and nursing note reviewed.  Constitutional:      Appearance: She is well-developed.  HENT:     Head: Normocephalic.  Cardiovascular:     Rate and Rhythm: Normal rate.  Pulmonary:     Effort: Pulmonary effort is normal.  Abdominal:     General: There is no distension.  Musculoskeletal:        General: Swelling and tenderness present.     Cervical back: Normal range of motion.     Comments: 28m open oozing area,  10x15 cm area of redness right lower leg  Skin:    General: Skin is warm.  Neurological:     General: No focal deficit present.     Mental Status: She is alert and oriented to person, place, and time.      UC Treatments / Results  Labs (all labs ordered are listed, but only abnormal results are displayed) Labs Reviewed - No data to  display  EKG   Radiology No results found.  Procedures Procedures (including critical care time)  Medications Ordered in UC Medications - No data to  display  Initial Impression / Assessment and Plan / UC Course  I have reviewed the triage vital signs and the nursing notes.  Pertinent labs & imaging results that were available during my care of the patient were reviewed by me and considered in my medical decision making (see chart for details).     MDM:  Pt counseled on wound infection.  Pt started on Augmentin.  Pt advised to soak area.   Final Clinical Impressions(s) / UC Diagnoses   Final diagnoses:  Cellulitis, unspecified cellulitis site  Dog bite of right lower leg, initial encounter     Discharge Instructions      Return if any problems.    ED Prescriptions     Medication Sig Dispense Auth. Provider   amoxicillin-clavulanate (AUGMENTIN) 875-125 MG tablet Take 1 tablet by mouth 2 (two) times daily. 20 tablet Fransico Meadow, [place]      PDMP not reviewed this encounter. An After Visit Summary was printed and given to the patient.    Fransico Meadow, [place] 09/06/21 [phone]

## 2023-05-01 ENCOUNTER — Ambulatory Visit (INDEPENDENT_AMBULATORY_CARE_PROVIDER_SITE_OTHER): Payer: Commercial Managed Care - PPO | Admitting: Family Medicine

## 2023-05-01 ENCOUNTER — Encounter: Payer: Self-pay | Admitting: Family Medicine

## 2023-05-01 VITALS — BP 145/78 | HR 75 | Ht 64.5 in | Wt 144.8 lb

## 2023-05-01 DIAGNOSIS — E559 Vitamin D deficiency, unspecified: Secondary | ICD-10-CM | POA: Diagnosis not present

## 2023-05-01 DIAGNOSIS — R030 Elevated blood-pressure reading, without diagnosis of hypertension: Secondary | ICD-10-CM

## 2023-05-01 DIAGNOSIS — E7800 Pure hypercholesterolemia, unspecified: Secondary | ICD-10-CM

## 2023-05-01 DIAGNOSIS — Z7689 Persons encountering health services in other specified circumstances: Secondary | ICD-10-CM | POA: Diagnosis not present

## 2023-05-01 MED ORDER — ATORVASTATIN CALCIUM 20 MG PO TABS: 20.0000 mg | ORAL_TABLET | Freq: Every day | ORAL | 3 refills | Status: AC

## 2023-05-01 MED ORDER — EZETIMIBE 10 MG PO TABS: 5.0000 mg | ORAL_TABLET | Freq: Every day | ORAL | 3 refills | Status: AC

## 2023-05-01 NOTE — Patient Instructions (Signed)
 It was nice to see you today,  We addressed the following topics today: -I am sending in refills of your cholesterol medication. - I provided a blood pressure log for you to take home and document blood pressures 1-2 times a day for the next 2 weeks.  You can then return back to [place] or send a picture of it through MyChart.   Have a great day,  Frederic Jericho, MD

## 2023-05-01 NOTE — Progress Notes (Signed)
 New Patient Office Visit  Subjective   Patient ID: Heather Griffin, female    DOB: Feb 22, 1961  Age: 63 y.o. MRN: [id number]  CC:  Chief Complaint  Patient presents with   New Patient (Initial Visit)    HPI Heather Griffin presents to establish care Patient was previously seeing Dr. Renne Crigler.  Patient has a history of hyperlipidemia and vitamin D deficiency.  She takes 20 mg atorvastatin, 5 mg of Zetia and 2000 IU of vitamin D.  Patient has an upcoming surgery on her left hand for cyst removal on her thumb.  Has had similar surgery on her index finger in the past.   PMH: HLD,   PSH: left index finger fusion for cyst.  Complete hysterectomy in 2013 for fibroids.  Cataract - b/l.  BTL - 1992.  Tonsillectomy - 1978.  L knee -menisctomy.    FH: cancer- mother - breast; HTN - father and mother; HLD - mother and father.  CVA - grandfather - father side. ;  autoimmune - PGM -lupus.  Sister - bechet's.   Brother - RA.     Tobacco use: no Alcohol use: yes - daliy glass red wine.   Drug use: no Marital status: married.  - female.  No children. Employment: [employer] trader.  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    Screenings:  Colon Cancer: utd - polyp - 10 year f/u Lung Cancer: N/A Breast Cancer: few months ago.  Dr. Henderson Cloud.  Still gets pap smear. - has had abnormal Paps for 2 hysterectomy.   Outpatient Encounter Medications as of 05/01/2023  Medication Sig   amoxicillin-clavulanate (AUGMENTIN) 875-125 MG tablet Take 1 tablet by mouth 2 (two) times daily.   atorvastatin (LIPITOR) 20 MG tablet Take 1 tablet (20 mg total) by mouth daily.   Besifloxacin HCl (BESIVANCE) 0.6 % SUSP Besivance 0.6 % eye drops,suspension  STARTING 3 DAYS PRIOR TO SURGERY INSTILL 1 DROP 3 TIMES PER DAY INTO OPERATIVE EYE AS DIRECTED   Bromfenac Sodium (PROLENSA) 0.07 % SOLN Prolensa 0.07 % eye drops  STARTING 3 DAYS PRIOR TO SURGERY INSTILL 1 DROP INTO OPERATIVE EYE TWICE DAILY at bedtime AS DIRECTED   cholecalciferol  (VITAMIN D) 1000 UNITS tablet Take 2,000 Units by mouth every morning.   Difluprednate (DUREZOL) 0.05 % EMUL Durezol 0.05 % eye drops  INSTILL 1 DROP INTO OPERATIVE EYE THREE TIMES DAILY STARTING 3 DAYS PRIOR TO SURGERY AND CONTINUE FOR 1 WEEK AFTER SURGERY, THEN 2 TIMES A D   ezetimibe (ZETIA) 10 MG tablet Take 0.5 tablets (5 mg total) by mouth daily.   HYDROcodone-acetaminophen (NORCO) 5-325 MG tablet Take 1 tablet by mouth every 6 (six) hours as needed.   ibuprofen (ADVIL) 200 MG tablet Take by mouth.   ibuprofen (ADVIL,MOTRIN) 200 MG tablet Take 600 mg by mouth 2 (two) times daily.   [DISCONTINUED] atorvastatin (LIPITOR) 20 MG tablet Take 20 mg by mouth daily.   [DISCONTINUED] atorvastatin (LIPITOR) 20 MG tablet atorvastatin 20 mg tablet   1 tablet every day by oral route.   [DISCONTINUED] ezetimibe (ZETIA) 10 MG tablet Take 10 mg by mouth daily.   No facility-administered encounter medications on file as of 05/01/2023.    Past Medical History:  Diagnosis Date   Hyperlipidemia    Hyperthyroidism    h/o hyper - 1995   Infections of kidney    x 2 strep inf of kidney- 1978 and 1982   Mucoid cyst of joint    left index finger    Past  Surgical History:  Procedure Laterality Date   ABDOMINAL HYSTERECTOMY  05/16/2011   Procedure: HYSTERECTOMY ABDOMINAL;  Surgeon: Leslie Andrea, MD;  Location: [facility];  Service: Gynecology;  Laterality: N/A;   DISTAL INTERPHALANGEAL JOINT FUSION Left 12/28/2017   Procedure: FUSION DISTAL INTERPHALANGEAL JOINT  LEFT INDEX AND EXCISION MUCOID CYST;  Surgeon: Cindee Salt, MD;  Location: [facility];  Service: Orthopedics;  Laterality: Left;  AXILLARY BLOCK   KNEE ARTHROTOMY     MASS EXCISION Left 08/10/2017   Procedure: EXCISION MUCOID CYST WITH DISTAL INTERPHALANGEAL JOINT ARTHROTOMY LEFT INDEX FINGER;  Surgeon: Cindee Salt, MD;  Location: [facility];  Service: Orthopedics;  Laterality: Left;  FAB   SALPINGOOPHORECTOMY   05/16/2011   Procedure: SALPINGO OOPHERECTOMY;  Surgeon: Leslie Andrea, MD;  Location: [facility];  Service: Gynecology;  Laterality: Bilateral;   TONSILLECTOMY     TUBAL LIGATION  1993    Family History  Problem Relation Age of Onset   Hyperlipidemia Mother    Cancer Mother    Hyperlipidemia Father    Rheum arthritis Brother    Stroke Paternal Grandfather     Social History   Socioeconomic History   Marital status: Married    Spouse name: Not on file   Number of children: Not on file   Years of education: Not on file   Highest education level: Not on file  Occupational History   Not on file  Tobacco Use   Smoking status: Never   Smokeless tobacco: Never  Vaping Use   Vaping status: Never Used  Substance and Sexual Activity   Alcohol use: Yes    Alcohol/week: 2.0 standard drinks of alcohol    Types: 2 Glasses of wine per week    Comment: social   Drug use: No   Sexual activity: Not on file  Other Topics Concern   Not on file  Social History Narrative   Not on file   Social Drivers of Health   Financial Resource Strain: Not on file  Food Insecurity: Low Risk  (05/01/2023)   Received from [facility]   Hunger Vital Sign    Worried About Running Out of Food in the Last Year: Never true    Ran Out of Food in the Last Year: Never true  Transportation Needs: No Transportation Needs (05/01/2023)   Received from [facility]    In the past 12 months, has lack of reliable transportation kept you from medical appointments, meetings, work or from getting things needed for daily living? : No  Physical Activity: Not on file  Stress: Not on file  Social Connections: Not on file  Intimate Partner Violence: Not on file    ROS     Objective   BP (!) 145/78   Pulse 75   Ht 5' 4.5" (1.638 m)   Wt 144 lb 12.8 oz (65.7 kg)   LMP 03/18/2011   SpO2 100%   BMI 24.47 kg/m   Physical Exam  General: Alert, oriented HEENT: PERRLA, EOMI, moist  mucosa CV: Regular rate rhythm no murmurs Pulmonary: Lungs are bilaterally GI: Soft, normal bowel sounds MSK: Strength equal bilaterally Skin: Warm and dry Psych: Pleasant affect     Assessment & Plan:   Encounter to establish care  Hypercholesterolemia Assessment & Plan: Has had elevated cholesterol since her 20s.  Is currently taking atorvastatin 20 mg and Zetia 5 mg. - Recheck lipid panel - Refill medications today  Orders: -     Lipid panel; Future -     TSH; Future -     Hemoglobin A1c; Future  Vitamin D deficiency Assessment & Plan: Takes vitamin D 2000 IU daily.  Recheck vitamin D level  Orders: -     VITAMIN D 25 Hydroxy (Vit-D Deficiency, Fractures); Future  Elevated blood pressure reading without diagnosis of hypertension Assessment & Plan: Elevated today, repeat slightly better.  Patient has blood pressure cuff at home.  Advised her to check blood pressure at home over the next 2 weeks and return recorded values to [place].  At that time we can discuss if her blood pressure is elevated and needs treatment.   Other orders -     Atorvastatin Calcium; Take 1 tablet (20 mg total) by mouth daily.  Dispense: 90 tablet; Refill: 3 -     Ezetimibe; Take 0.5 tablets (5 mg total) by mouth daily.  Dispense: 45 tablet; Refill: 3    Return in about 1 year (around 04/30/2024) for physical.   Sandre Kitty, MD

## 2023-05-01 NOTE — Assessment & Plan Note (Signed)
 Has had elevated cholesterol since her 64s.  Is currently taking atorvastatin 20 mg and Zetia 5 mg. - Recheck lipid panel - Refill medications today

## 2023-05-01 NOTE — Assessment & Plan Note (Signed)
 Elevated today, repeat slightly better.  Patient has blood pressure cuff at home.  Advised her to check blood pressure at home over the next 2 weeks and return recorded values to [place].  At that time we can discuss if her blood pressure is elevated and needs treatment.

## 2023-05-01 NOTE — Assessment & Plan Note (Signed)
 Takes vitamin D 2000 IU daily.  Recheck vitamin D level

## 2023-05-02 ENCOUNTER — Other Ambulatory Visit

## 2023-05-02 DIAGNOSIS — E559 Vitamin D deficiency, unspecified: Secondary | ICD-10-CM

## 2023-05-02 DIAGNOSIS — E7800 Pure hypercholesterolemia, unspecified: Secondary | ICD-10-CM

## 2023-05-03 ENCOUNTER — Other Ambulatory Visit: Payer: Self-pay | Admitting: Orthopedic Surgery

## 2023-05-03 ENCOUNTER — Telehealth: Payer: Self-pay

## 2023-05-03 LAB — LIPID PANEL
Chol/HDL Ratio: 2.1 ratio (ref 0.0–4.4)
Cholesterol, Total: 210 mg/dL — ABNORMAL HIGH (ref 100–199)
HDL: 98 mg/dL (ref >39)
LDL Chol Calc (NIH): 98 mg/dL (ref 0–99)
Triglycerides: 80 mg/dL (ref 0–149)
VLDL Cholesterol Cal: 14 mg/dL (ref 5–40)

## 2023-05-03 LAB — HEMOGLOBIN A1C
Est. average glucose Bld gHb Est-mCnc: 103 mg/dL
Hgb A1c MFr Bld: 5.2 % (ref 4.8–5.6)

## 2023-05-03 LAB — VITAMIN D 25 HYDROXY (VIT D DEFICIENCY, FRACTURES): Vit D, 25-Hydroxy: 139 ng/mL — ABNORMAL HIGH (ref 30.0–100.0)

## 2023-05-03 LAB — TSH: TSH: 2.49 u[IU]/mL (ref 0.450–4.500)

## 2023-05-03 NOTE — Telephone Encounter (Signed)
 Elevated vitamin D level.  Patient on 2000 IUs of vitamin D daily.   LDL cholesterol normal.   Z6X normal and TSH normal.   Please call the patient and let her know her results.  Please let her know that I would like her to stop taking her vitamin D for at least a month and then we can recheck it in a month.  Please schedule her a lab visit a month from now.

## 2023-05-05 ENCOUNTER — Encounter (HOSPITAL_BASED_OUTPATIENT_CLINIC_OR_DEPARTMENT_OTHER): Payer: Self-pay | Admitting: Orthopedic Surgery

## 2023-05-08 NOTE — Progress Notes (Signed)

## 2023-05-12 ENCOUNTER — Encounter (HOSPITAL_BASED_OUTPATIENT_CLINIC_OR_DEPARTMENT_OTHER)
Admission: RE | Disposition: A | Discharge status: Home / Self Care | Payer: Self-pay | Source: Home / Self Care | Attending: Orthopedic Surgery

## 2023-05-12 ENCOUNTER — Ambulatory Visit (HOSPITAL_BASED_OUTPATIENT_CLINIC_OR_DEPARTMENT_OTHER): Admitting: Anesthesiology

## 2023-05-12 ENCOUNTER — Ambulatory Visit (HOSPITAL_BASED_OUTPATIENT_CLINIC_OR_DEPARTMENT_OTHER)
Admission: RE | Admit: 2023-05-12 | Discharge: 2023-05-12 | Disposition: A | Discharge status: Home / Self Care | Attending: Orthopedic Surgery | Admitting: Orthopedic Surgery

## 2023-05-12 ENCOUNTER — Other Ambulatory Visit: Payer: Self-pay

## 2023-05-12 ENCOUNTER — Encounter (HOSPITAL_BASED_OUTPATIENT_CLINIC_OR_DEPARTMENT_OTHER): Payer: Self-pay | Admitting: Orthopedic Surgery

## 2023-05-12 DIAGNOSIS — M19042 Primary osteoarthritis, left hand: Secondary | ICD-10-CM | POA: Diagnosis present

## 2023-05-12 DIAGNOSIS — Z791 Long term (current) use of non-steroidal anti-inflammatories (NSAID): Secondary | ICD-10-CM | POA: Diagnosis not present

## 2023-05-12 DIAGNOSIS — M25742 Osteophyte, left hand: Secondary | ICD-10-CM | POA: Diagnosis not present

## 2023-05-12 DIAGNOSIS — Z79899 Other long term (current) drug therapy: Secondary | ICD-10-CM | POA: Insufficient documentation

## 2023-05-12 DIAGNOSIS — L728 Other follicular cysts of the skin and subcutaneous tissue: Secondary | ICD-10-CM | POA: Insufficient documentation

## 2023-05-12 SURGERY — REMOVAL, CYST, HAND
Anesthesia: General | Site: Thumb | Laterality: Left

## 2023-05-12 MED ORDER — ACETAMINOPHEN 500 MG PO TABS
1000.0000 mg | ORAL_TABLET | Freq: Once | ORAL | Status: AC
  Administered 2023-05-12: 1000 mg via ORAL

## 2023-05-12 MED ORDER — DEXAMETHASONE SODIUM PHOSPHATE 10 MG/ML IJ SOLN
INTRAMUSCULAR | Status: DC | PRN
Start: 2023-05-12 — End: 2023-05-12
  Administered 2023-05-12: 10 mg via INTRAVENOUS

## 2023-05-12 MED ORDER — ACETAMINOPHEN 500 MG PO TABS
ORAL_TABLET | ORAL | Status: AC
  Filled 2023-05-12: qty 2

## 2023-05-12 MED ORDER — CEFAZOLIN SODIUM-DEXTROSE 2-4 GM/100ML-% IV SOLN
INTRAVENOUS | Status: AC
  Filled 2023-05-12: qty 100

## 2023-05-12 MED ORDER — FENTANYL CITRATE (PF) 100 MCG/2ML IJ SOLN
INTRAMUSCULAR | Status: DC | PRN
Start: 2023-05-12 — End: 2023-05-12
  Administered 2023-05-12 (×2): 50 ug via INTRAVENOUS

## 2023-05-12 MED ORDER — HYDROCODONE-ACETAMINOPHEN 5-325 MG PO TABS: 1.0000 | ORAL_TABLET | Freq: Four times a day (QID) | ORAL | 0 refills | Status: AC | PRN

## 2023-05-12 MED ORDER — DEXAMETHASONE SODIUM PHOSPHATE 10 MG/ML IJ SOLN
INTRAMUSCULAR | Status: AC
  Filled 2023-05-12: qty 1

## 2023-05-12 MED ORDER — LIDOCAINE 2% (20 MG/ML) 5 ML SYRINGE
INTRAMUSCULAR | Status: DC | PRN
  Administered 2023-05-12: 60 mg via INTRAVENOUS

## 2023-05-12 MED ORDER — MIDAZOLAM HCL 2 MG/2ML IJ SOLN
INTRAMUSCULAR | Status: AC
  Filled 2023-05-12: qty 2

## 2023-05-12 MED ORDER — ONDANSETRON HCL 4 MG/2ML IJ SOLN
INTRAMUSCULAR | Status: DC | PRN
  Administered 2023-05-12: 4 mg via INTRAVENOUS

## 2023-05-12 MED ORDER — CEFAZOLIN SODIUM-DEXTROSE 2-4 GM/100ML-% IV SOLN
2.0000 g | INTRAVENOUS | Status: AC
Start: 2023-05-13 — End: 2023-05-12
  Administered 2023-05-12: 2 g via INTRAVENOUS

## 2023-05-12 MED ORDER — MIDAZOLAM HCL 5 MG/5ML IJ SOLN
INTRAMUSCULAR | Status: DC | PRN
  Administered 2023-05-12: 1 mg via INTRAVENOUS

## 2023-05-12 MED ORDER — 0.9 % SODIUM CHLORIDE (POUR BTL) OPTIME
TOPICAL | Status: DC | PRN
  Administered 2023-05-12: 1000 mL

## 2023-05-12 MED ORDER — BUPIVACAINE HCL (PF) 0.25 % IJ SOLN
INTRAMUSCULAR | Status: DC | PRN
  Administered 2023-05-12: 9 mL

## 2023-05-12 MED ORDER — FENTANYL CITRATE (PF) 100 MCG/2ML IJ SOLN
INTRAMUSCULAR | Status: AC
  Filled 2023-05-12: qty 2

## 2023-05-12 MED ORDER — LIDOCAINE 2% (20 MG/ML) 5 ML SYRINGE
INTRAMUSCULAR | Status: AC
  Filled 2023-05-12: qty 5

## 2023-05-12 MED ORDER — FENTANYL CITRATE (PF) 100 MCG/2ML IJ SOLN: 25.0000 ug | INTRAMUSCULAR | Status: DC | PRN

## 2023-05-12 MED ORDER — ONDANSETRON HCL 4 MG/2ML IJ SOLN
INTRAMUSCULAR | Status: AC
  Filled 2023-05-12: qty 2

## 2023-05-12 MED ORDER — PROPOFOL 10 MG/ML IV BOLUS
INTRAVENOUS | Status: DC | PRN
Start: 2023-05-12 — End: 2023-05-12
  Administered 2023-05-12: 200 ug via INTRAVENOUS
  Administered 2023-05-12: 150 ug/kg/min via INTRAVENOUS

## 2023-05-12 MED ORDER — LACTATED RINGERS IV SOLN: INTRAVENOUS | Status: DC

## 2023-05-12 SURGICAL SUPPLY — 28 items
BLADE SURG 15 STRL LF DISP TIS (BLADE) ×4 IMPLANT
BNDG COHESIVE 1X5 TAN STRL LF (GAUZE/BANDAGES/DRESSINGS) IMPLANT
BNDG ESMARK 4X9 LF (GAUZE/BANDAGES/DRESSINGS) IMPLANT
CHLORAPREP W/TINT 26 (MISCELLANEOUS) ×2 IMPLANT
CORD BIPOLAR FORCEPS 12FT (ELECTRODE) ×2 IMPLANT
COVER BACK TABLE 60X90IN (DRAPES) ×2 IMPLANT
COVER MAYO STAND STRL (DRAPES) ×2 IMPLANT
CUFF TOURN SGL QUICK 18X4 (TOURNIQUET CUFF) ×2 IMPLANT
DRAPE EXTREMITY T 121X128X90 (DISPOSABLE) ×2 IMPLANT
DRAPE SURG 17X23 STRL (DRAPES) ×2 IMPLANT
GAUZE SPONGE 4X4 12PLY STRL (GAUZE/BANDAGES/DRESSINGS) ×2 IMPLANT
GAUZE XEROFORM 1X8 LF (GAUZE/BANDAGES/DRESSINGS) ×2 IMPLANT
GLOVE BIO SURGEON STRL SZ7.5 (GLOVE) ×2 IMPLANT
GLOVE BIOGEL PI IND STRL 7.5 (GLOVE) IMPLANT
GLOVE BIOGEL PI IND STRL 8 (GLOVE) ×2 IMPLANT
GLOVE SURG SS PI 7.5 STRL IVOR (GLOVE) IMPLANT
GOWN STRL REUS W/TWL XL LVL3 (GOWN DISPOSABLE) IMPLANT
NDL HYPO 25X1 1.5 SAFETY (NEEDLE) ×2 IMPLANT
NEEDLE HYPO 25X1 1.5 SAFETY (NEEDLE) ×2 IMPLANT
NS IRRIG 1000ML POUR BTL (IV SOLUTION) ×2 IMPLANT
PACK BASIN DAY SURGERY FS (CUSTOM PROCEDURE TRAY) ×2 IMPLANT
SPLINT FINGER 3.25 911903 (SOFTGOODS) IMPLANT
STOCKINETTE 4X48 STRL (DRAPES) ×2 IMPLANT
SUT ETHILON 4 0 PS 2 18 (SUTURE) ×2 IMPLANT
SYR BULB EAR ULCER 3OZ GRN STR (SYRINGE) ×2 IMPLANT
SYR CONTROL 10ML LL (SYRINGE) ×2 IMPLANT
TOWEL GREEN STERILE FF (TOWEL DISPOSABLE) ×4 IMPLANT
UNDERPAD 30X36 HEAVY ABSORB (UNDERPADS AND DIAPERS) ×2 IMPLANT

## 2023-05-12 NOTE — H&P (Signed)
 Heather Griffin is an 63 y.o. female.   Chief Complaint: mucoid cyst HPI: 63 yo female with left thumb mucoid cyst and ip joint arthritis.  This is bothersome to her.  She wishes to have excision of the cyst and debridement of the ip joint to try to prevent recurrence.  Allergies:  Allergies  Allergen Reactions   Erythromycin Nausea And Vomiting    Past Medical History:  Diagnosis Date   Hyperlipidemia    Hyperthyroidism    h/o hyper - 1995   Infections of kidney    x 2 strep inf of kidney- 1978 and 1982   Mucoid cyst of joint    left index finger    Past Surgical History:  Procedure Laterality Date   ABDOMINAL HYSTERECTOMY  05/16/2011   Procedure: HYSTERECTOMY ABDOMINAL;  Surgeon: Leslie Andrea, MD;  Location: [facility];  Service: Gynecology;  Laterality: N/A;   DISTAL INTERPHALANGEAL JOINT FUSION Left 12/28/2017   Procedure: FUSION DISTAL INTERPHALANGEAL JOINT  LEFT INDEX AND EXCISION MUCOID CYST;  Surgeon: Cindee Salt, MD;  Location: [facility];  Service: Orthopedics;  Laterality: Left;  AXILLARY BLOCK   KNEE ARTHROTOMY     MASS EXCISION Left 08/10/2017   Procedure: EXCISION MUCOID CYST WITH DISTAL INTERPHALANGEAL JOINT ARTHROTOMY LEFT INDEX FINGER;  Surgeon: Cindee Salt, MD;  Location: [facility];  Service: Orthopedics;  Laterality: Left;  FAB   SALPINGOOPHORECTOMY  05/16/2011   Procedure: SALPINGO OOPHERECTOMY;  Surgeon: Leslie Andrea, MD;  Location: [facility];  Service: Gynecology;  Laterality: Bilateral;   TONSILLECTOMY     TUBAL LIGATION  1993    Family History: Family History  Problem Relation Age of Onset   Hyperlipidemia Mother    Cancer Mother    Hyperlipidemia Father    Rheum arthritis Brother    Stroke Paternal Grandfather     Social History:   reports that she has never smoked. She has never used smokeless tobacco. She reports current alcohol use of about 2.0 standard drinks of alcohol per week. She reports that she does not  use drugs.  Medications: Medications Prior to Admission  Medication Sig Dispense Refill   atorvastatin (LIPITOR) 20 MG tablet Take 1 tablet (20 mg total) by mouth daily. 90 tablet 3   cholecalciferol (VITAMIN D) 1000 UNITS tablet Take 2,000 Units by mouth every morning.     ezetimibe (ZETIA) 10 MG tablet Take 0.5 tablets (5 mg total) by mouth daily. 45 tablet 3   ibuprofen (ADVIL,MOTRIN) 200 MG tablet Take 600 mg by mouth 2 (two) times daily.     ibuprofen (ADVIL) 200 MG tablet Take by mouth.      No results found for this or any previous visit (from the past 48 hours).  No results found.    Height 5\' 5"  (1.651 m), weight 63.5 kg, last menstrual period 03/18/2011.  General appearance: alert, cooperative, and appears stated age Head: Normocephalic, without obvious abnormality, atraumatic Neck: supple, symmetrical, trachea midline Extremities: Intact sensation and capillary refill all digits.  +epl/fpl/io.  No wounds.  Skin: Skin color, texture, turgor normal. No rashes or lesions Neurologic: Grossly normal Incision/Wound: none  Assessment/Plan Left thumb mucoid cyst and ip joint arthritis.  Non operative and operative treatment options have been discussed with the patient and patient wishes to proceed with operative treatment. Risks, benefits, and alternatives of surgery have been discussed and the patient agrees with the plan of care.   [name] 05/12/2023, 1:05 PM

## 2023-05-12 NOTE — Discharge Instructions (Addendum)
 [facility] Instructions Hand Surgery  Wound Care: Keep your hand elevated above the level of your heart.  Do not allow it to dangle by your side.  Keep the dressing dry and do not remove it unless your doctor advises you to do so.  He will usually change it at the time of your post-op visit.  Moving your fingers is advised to stimulate circulation but will depend on the site of your surgery.  If you have a splint applied, your doctor will advise you regarding movement.  Activity: Do not drive or operate machinery today.  Rest today and then you may return to your normal activity and work as indicated by your physician.  Diet:  Drink liquids today or eat a light diet.  You may resume a regular diet tomorrow.    General expectations: Pain for two to three days. Fingers may become slightly swollen.  Call your doctor if any of the following occur: Severe pain not relieved by pain medication. Elevated temperature. Dressing soaked with blood. Inability to move fingers. White or bluish color to fingers.    Post Anesthesia Home Care Instructions  Activity: Get plenty of rest for the remainder of the day. A responsible individual must stay with you for 24 hours following the procedure.  For the next 24 hours, DO NOT: -Drive a car -Advertising copywriter -Drink alcoholic beverages -Take any medication unless instructed by your physician -Make any legal decisions or sign important papers.  Meals: Start with liquid foods such as gelatin or soup. Progress to regular foods as tolerated. Avoid greasy, spicy, heavy foods. If nausea and/or vomiting occur, drink only clear liquids until the nausea and/or vomiting subsides. Call your physician if vomiting continues.  Special Instructions/Symptoms: Your throat may feel dry or sore from the anesthesia or the breathing tube placed in your throat during surgery. If this causes discomfort, gargle with warm salt water. The discomfort should disappear  within 24 hours.  If you had a scopolamine patch placed behind your ear for the management of post- operative nausea and/or vomiting:  1. The medication in the patch is effective for 72 hours, after which it should be removed.  Wrap patch in a tissue and discard in the trash. Wash hands thoroughly with soap and water. 2. You may remove the patch earlier than 72 hours if you experience unpleasant side effects which may include dry mouth, dizziness or visual disturbances. 3. Avoid touching the patch. Wash your hands with soap and water after contact with the patch.     Last tylenol received at 1:15pm

## 2023-05-12 NOTE — Anesthesia Procedure Notes (Signed)
 Procedure Name: LMA Insertion Date/Time: 05/12/2023 3:04 PM  Performed by: Roosvelt Harps, CRNAPre-anesthesia Checklist: Patient identified, Emergency Drugs available, Suction available and Patient being monitored Patient Re-evaluated:Patient Re-evaluated prior to induction Oxygen Delivery Method: Circle System Utilized Preoxygenation: Pre-oxygenation with 100% oxygen Induction Type: IV induction Ventilation: Mask ventilation without difficulty LMA: LMA inserted LMA Size: 4.0 Number of attempts: 1 Airway Equipment and Method: Bite block Placement Confirmation: positive ETCO2 Tube secured with: Tape Dental Injury: Teeth and Oropharynx as per pre-operative assessment

## 2023-05-12 NOTE — Op Note (Signed)
 NAME: Heather Griffin MEDICAL RECORD NO: [id number] DATE OF BIRTH: 1960/11/05 FACILITY: [facility] LOCATION: [facility] PHYSICIAN: Tami Ribas, MD   OPERATIVE REPORT   DATE OF PROCEDURE: 05/12/23    PREOPERATIVE DIAGNOSIS: Left thumb mucoid cyst and IP joint arthritis   POSTOPERATIVE DIAGNOSIS: Left thumb mucoid cyst and IP joint arthritis   PROCEDURE: 1.  Left thumb excision of mucoid cyst 2.  Left debridement of IP joint including osteophyte from proximal phalanx   SURGEON:  [name], M.D.   ASSISTANT: none   ANESTHESIA:  General   INTRAVENOUS FLUIDS:  Per anesthesia flow sheet.   ESTIMATED BLOOD LOSS:  Minimal.   COMPLICATIONS:  None.   SPECIMENS: Left thumb mucoid cyst to pathology   TOURNIQUET TIME:    Total Tourniquet Time Documented: Upper Arm (Left) - 18 minutes Total: Upper Arm (Left) - 18 minutes    DISPOSITION:  Stable to PACU.   INDICATIONS: 63 year old female with left thumb mucoid cyst.  This is bothersome to her.  She wishes to have it removed and the IP joint debrided to try to prevent recurrence.  Risks, benefits and alternatives of surgery were discussed including the risks of blood loss, infection, damage to nerves, vessels, tendons, ligaments, bone for surgery, need for additional surgery, complications with wound healing, continued pain, stiffness, , recurrence.  She voiced understanding of these risks and elected to proceed.  OPERATIVE COURSE:  After being identified preoperatively by myself,  the patient and I agreed on the procedure and site of the procedure.  The surgical site was marked.  Surgical consent had been signed. Preoperative IV antibiotic prophylaxis was given. She was transferred to the operating room and placed on the operating table in supine position with the left upper extremity on an arm board.  General anesthesia was induced by the anesthesiologist.  Left upper extremity was prepped and draped in normal sterile  orthopedic fashion.  A surgical pause was performed between the surgeons, anesthesia, and operating room staff and all were in agreement as to the patient, procedure, and site of procedure.  Tourniquet at the proximal aspect of the extremity was inflated to 250 mmHg after exsanguination of the arm with an Esmarch bandage.  A hockey-stick shaped incision was made at the dorsum of the left thumb IP joint.  This was carried into subcutaneous tissues by spreading technique.  The cyst was easily identified.  It was filled with clear gelatinous fluid.  It was freed up from surrounding tissues.  Was removed with the synovectomy rongeurs.  Was sent to pathology for examination.  DIP joint was entered underneath the extensor tendon.  The synovectomy rongeurs were used to perform synovectomy of the joint removing some remaining cyst underneath the tendon.  There was osteophyte at the dorsum of the proximal phalanx which was removed.  The joint and wound were copiously irrigated with sterile saline.  The wound was then closed with 4-0 nylon in a horizontal mattress fashion.  Digital block was performed with quarter percent plain Marcaine to aid in postoperative analgesia.  The wound was dressed with sterile Xeroform and 4 x 4 and wrapped with a Coban dressing lightly.  AlumaFoam splint was placed and wrapped lightly with Coban dressing.  The tourniquet was deflated at 18 minutes.  Fingertips were pink with brisk capillary refill after deflation of tourniquet.  The operative  drapes were broken down.  The patient was awoken from anesthesia safely.  She was transferred back to the stretcher  and taken to PACU in stable condition.  I will see her back in the office in 1 week for postoperative followup.  I will give her a prescription for Norco 5/325 1 tab PO q6 hours prn pain, dispense # 15.   [name], MD Electronically signed, 05/12/23

## 2023-05-12 NOTE — Transfer of Care (Signed)
 Immediate Anesthesia Transfer of Care Note  Patient: Heather Griffin  Procedure(s) Performed: REMOVAL, CYST, HAND (Left: Thumb) ARTHROTOMY (Left)  Patient Location: PACU  Anesthesia Type:General  Level of Consciousness: awake  Airway & Oxygen Therapy: Patient Spontanous Breathing and Patient connected to face mask oxygen  Post-op Assessment: Report given to RN and Post -op Vital signs reviewed and stable  Post vital signs: Reviewed and stable  Last Vitals:  Vitals Value Taken Time  BP 154/78 05/12/23 1539  Temp    Pulse 67 05/12/23 1540  Resp 20 05/12/23 1540  SpO2 100 % 05/12/23 1540  Vitals shown include unfiled device data.  Last Pain:  Vitals:   05/12/23 1306  TempSrc: Temporal  PainSc: 0-No pain         Complications: No notable events documented.

## 2023-05-12 NOTE — Anesthesia Preprocedure Evaluation (Addendum)
 Anesthesia Evaluation  Patient identified by MRN, date of birth, ID band Patient awake    Reviewed: Allergy & Precautions, H&P , NPO status , Patient's Chart, lab work & pertinent test results  Airway Mallampati: II  TM Distance: >3 FB Neck ROM: Full    Dental no notable dental hx. (+) Teeth Intact, Dental Advisory Given   Pulmonary neg pulmonary ROS   Pulmonary exam normal breath sounds clear to auscultation       Cardiovascular negative cardio ROS  Rhythm:Regular Rate:Normal     Neuro/Psych negative neurological ROS  negative psych ROS   GI/Hepatic negative GI ROS, Neg liver ROS,,,  Endo/Other  negative endocrine ROS    Renal/GU negative Renal ROS  negative genitourinary   Musculoskeletal  (+) Arthritis ,    Abdominal   Peds  Hematology negative hematology ROS (+)   Anesthesia Other Findings   Reproductive/Obstetrics negative OB ROS                             Anesthesia Physical Anesthesia Plan  ASA: 2  Anesthesia Plan: General   Post-op Pain Management: Tylenol PO (pre-op)*   Induction: Intravenous  PONV Risk Score and Plan: 4 or greater and Ondansetron, Propofol infusion, TIVA, Dexamethasone and Treatment may vary due to age or medical condition  Airway Management Planned: LMA  Additional Equipment:   Intra-op Plan:   Post-operative Plan: Extubation in OR  Informed Consent: I have reviewed the patients History and Physical, chart, labs and discussed the procedure including the risks, benefits and alternatives for the proposed anesthesia with the patient or authorized representative who has indicated his/her understanding and acceptance.     Dental advisory given  Plan Discussed with: CRNA  Anesthesia Plan Comments:        Anesthesia Quick Evaluation

## 2023-05-12 NOTE — Anesthesia Postprocedure Evaluation (Signed)
 Anesthesia Post Note  Patient: Heather Griffin  Procedure(s) Performed: REMOVAL, CYST, HAND (Left: Thumb) ARTHROTOMY (Left)     Patient location during evaluation: PACU Anesthesia Type: General Level of consciousness: awake and alert Pain management: pain level controlled Vital Signs Assessment: post-procedure vital signs reviewed and stable Respiratory status: spontaneous breathing, nonlabored ventilation and respiratory function stable Cardiovascular status: blood pressure returned to baseline and stable Postop Assessment: no apparent nausea or vomiting Anesthetic complications: no  No notable events documented.  Last Vitals:  Vitals:   05/12/23 1545 05/12/23 1559  BP: (!) 158/79 (!) 171/84  Pulse: 60 62  Resp: 12 15  Temp:    SpO2: 100% 97%    Last Pain:  Vitals:   05/12/23 1559  TempSrc:   PainSc: 0-No pain                 Young Mulvey,W. EDMOND

## 2023-05-13 ENCOUNTER — Encounter (HOSPITAL_BASED_OUTPATIENT_CLINIC_OR_DEPARTMENT_OTHER): Payer: Self-pay | Admitting: Orthopedic Surgery

## 2023-05-16 LAB — SURGICAL PATHOLOGY

## 2023-07-31 ENCOUNTER — Other Ambulatory Visit: Payer: Self-pay | Admitting: Ophthalmology

## 2023-08-02 LAB — SURGICAL PATHOLOGY

## 2023-10-26 ENCOUNTER — Other Ambulatory Visit: Payer: Self-pay | Admitting: Orthopedic Surgery

## 2023-11-21 ENCOUNTER — Other Ambulatory Visit: Payer: Self-pay

## 2023-11-21 ENCOUNTER — Encounter (HOSPITAL_BASED_OUTPATIENT_CLINIC_OR_DEPARTMENT_OTHER): Payer: Self-pay | Admitting: Orthopedic Surgery

## 2023-11-24 NOTE — Anesthesia Preprocedure Evaluation (Addendum)
 Anesthesia Evaluation  Patient identified by MRN, date of birth, ID band Patient awake    Reviewed: Allergy & Precautions, NPO status , Patient's Chart, lab work & pertinent test results  Airway Mallampati: II  TM Distance: >3 FB Neck ROM: Full    Dental  (+) Teeth Intact, Dental Advisory Given   Pulmonary neg pulmonary ROS   Pulmonary exam normal breath sounds clear to auscultation       Cardiovascular hypertension (180/90 preop, no home meds- has been monitoring), Normal cardiovascular exam Rhythm:Regular Rate:Normal     Neuro/Psych negative neurological ROS  negative psych ROS   GI/Hepatic negative GI ROS, Neg liver ROS,,,  Endo/Other  negative endocrine ROS    Renal/GU negative Renal ROS  negative genitourinary   Musculoskeletal  (+) Arthritis , Osteoarthritis,    Abdominal   Peds  Hematology negative hematology ROS (+)   Anesthesia Other Findings   Reproductive/Obstetrics negative OB ROS                              Anesthesia Physical Anesthesia Plan  ASA: 3  Anesthesia Plan: General   Post-op Pain Management: Tylenol  PO (pre-op)*   Induction: Intravenous  PONV Risk Score and Plan: 3 and Ondansetron , Dexamethasone , Propofol  infusion, TIVA, Midazolam  and Treatment may vary due to age or medical condition  Airway Management Planned: LMA  Additional Equipment: None  Intra-op Plan:   Post-operative Plan: Extubation in OR  Informed Consent: I have reviewed the patients History and Physical, chart, labs and discussed the procedure including the risks, benefits and alternatives for the proposed anesthesia with the patient or authorized representative who has indicated his/her understanding and acceptance.     Dental advisory given  Plan Discussed with: CRNA  Anesthesia Plan Comments:          Anesthesia Quick Evaluation

## 2023-11-27 ENCOUNTER — Ambulatory Visit (HOSPITAL_BASED_OUTPATIENT_CLINIC_OR_DEPARTMENT_OTHER): Payer: Self-pay | Admitting: Anesthesiology

## 2023-11-27 ENCOUNTER — Ambulatory Visit (HOSPITAL_BASED_OUTPATIENT_CLINIC_OR_DEPARTMENT_OTHER)
Admission: RE | Admit: 2023-11-27 | Discharge: 2023-11-27 | Disposition: A | Discharge status: Home / Self Care | Attending: Orthopedic Surgery | Admitting: Orthopedic Surgery

## 2023-11-27 ENCOUNTER — Encounter (HOSPITAL_BASED_OUTPATIENT_CLINIC_OR_DEPARTMENT_OTHER): Payer: Self-pay | Admitting: Orthopedic Surgery

## 2023-11-27 ENCOUNTER — Encounter (HOSPITAL_BASED_OUTPATIENT_CLINIC_OR_DEPARTMENT_OTHER)
Admission: RE | Disposition: A | Discharge status: Home / Self Care | Payer: Self-pay | Source: Home / Self Care | Attending: Orthopedic Surgery

## 2023-11-27 ENCOUNTER — Other Ambulatory Visit: Payer: Self-pay

## 2023-11-27 DIAGNOSIS — Z01818 Encounter for other preprocedural examination: Secondary | ICD-10-CM

## 2023-11-27 DIAGNOSIS — I10 Essential (primary) hypertension: Secondary | ICD-10-CM

## 2023-11-27 DIAGNOSIS — Z79899 Other long term (current) drug therapy: Secondary | ICD-10-CM | POA: Diagnosis not present

## 2023-11-27 DIAGNOSIS — M19041 Primary osteoarthritis, right hand: Secondary | ICD-10-CM

## 2023-11-27 DIAGNOSIS — M25741 Osteophyte, right hand: Secondary | ICD-10-CM | POA: Diagnosis not present

## 2023-11-27 DIAGNOSIS — Z791 Long term (current) use of non-steroidal anti-inflammatories (NSAID): Secondary | ICD-10-CM | POA: Insufficient documentation

## 2023-11-27 DIAGNOSIS — L728 Other follicular cysts of the skin and subcutaneous tissue: Secondary | ICD-10-CM | POA: Diagnosis not present

## 2023-11-27 DIAGNOSIS — E7800 Pure hypercholesterolemia, unspecified: Secondary | ICD-10-CM

## 2023-11-27 DIAGNOSIS — M6740 Ganglion, unspecified site: Secondary | ICD-10-CM | POA: Diagnosis present

## 2023-11-27 DIAGNOSIS — E785 Hyperlipidemia, unspecified: Secondary | ICD-10-CM | POA: Diagnosis not present

## 2023-11-27 HISTORY — DX: Unspecified osteoarthritis, unspecified site: M19.90

## 2023-11-27 SURGERY — REMOVAL, CYST, HAND
Anesthesia: General | Site: Index Finger | Laterality: Right

## 2023-11-27 MED ORDER — ACETAMINOPHEN 500 MG PO TABS
ORAL_TABLET | ORAL | Status: AC
  Filled 2023-11-27: qty 2

## 2023-11-27 MED ORDER — LACTATED RINGERS IV SOLN
INTRAVENOUS | Status: DC
Start: 2023-11-27 — End: 2023-11-27

## 2023-11-27 MED ORDER — FENTANYL CITRATE (PF) 100 MCG/2ML IJ SOLN
INTRAMUSCULAR | Status: AC
  Filled 2023-11-27: qty 2

## 2023-11-27 MED ORDER — FENTANYL CITRATE (PF) 100 MCG/2ML IJ SOLN: 25.0000 ug | INTRAMUSCULAR | Status: DC | PRN

## 2023-11-27 MED ORDER — MIDAZOLAM HCL 2 MG/2ML IJ SOLN
INTRAMUSCULAR | Status: AC
  Filled 2023-11-27: qty 2

## 2023-11-27 MED ORDER — AMISULPRIDE (ANTIEMETIC) 5 MG/2ML IV SOLN: 10.0000 mg | Freq: Once | INTRAVENOUS | Status: DC | PRN

## 2023-11-27 MED ORDER — LIDOCAINE HCL (CARDIAC) PF 100 MG/5ML IV SOSY
PREFILLED_SYRINGE | INTRAVENOUS | Status: DC | PRN
  Administered 2023-11-27: 60 mg via INTRAVENOUS

## 2023-11-27 MED ORDER — ONDANSETRON HCL 4 MG/2ML IJ SOLN: 4.0000 mg | Freq: Once | INTRAMUSCULAR | Status: DC | PRN

## 2023-11-27 MED ORDER — OXYCODONE HCL 5 MG/5ML PO SOLN: 5.0000 mg | Freq: Once | ORAL | Status: DC | PRN

## 2023-11-27 MED ORDER — CEFAZOLIN SODIUM-DEXTROSE 2-4 GM/100ML-% IV SOLN
INTRAVENOUS | Status: AC
  Filled 2023-11-27: qty 100

## 2023-11-27 MED ORDER — 0.9 % SODIUM CHLORIDE (POUR BTL) OPTIME
TOPICAL | Status: DC | PRN
  Administered 2023-11-27: 50 mL

## 2023-11-27 MED ORDER — MIDAZOLAM HCL 2 MG/2ML IJ SOLN
INTRAMUSCULAR | Status: DC | PRN
  Administered 2023-11-27: 2 mg via INTRAVENOUS

## 2023-11-27 MED ORDER — ACETAMINOPHEN 500 MG PO TABS
1000.0000 mg | ORAL_TABLET | Freq: Once | ORAL | Status: AC
  Administered 2023-11-27: 1000 mg via ORAL

## 2023-11-27 MED ORDER — CEFAZOLIN SODIUM-DEXTROSE 2-4 GM/100ML-% IV SOLN
2.0000 g | INTRAVENOUS | Status: AC
  Administered 2023-11-27: 2 g via INTRAVENOUS

## 2023-11-27 MED ORDER — PROPOFOL 10 MG/ML IV BOLUS
INTRAVENOUS | Status: DC | PRN
  Administered 2023-11-27: 150 mg via INTRAVENOUS

## 2023-11-27 MED ORDER — ONDANSETRON HCL 4 MG/2ML IJ SOLN
INTRAMUSCULAR | Status: DC | PRN
  Administered 2023-11-27: 4 mg via INTRAVENOUS

## 2023-11-27 MED ORDER — FENTANYL CITRATE (PF) 100 MCG/2ML IJ SOLN
INTRAMUSCULAR | Status: DC | PRN
  Administered 2023-11-27 (×2): 50 ug via INTRAVENOUS

## 2023-11-27 MED ORDER — OXYCODONE HCL 5 MG PO TABS: 5.0000 mg | ORAL_TABLET | Freq: Once | ORAL | Status: DC | PRN

## 2023-11-27 MED ORDER — PROPOFOL 500 MG/50ML IV EMUL
INTRAVENOUS | Status: DC | PRN
  Administered 2023-11-27: 150 ug/kg/min via INTRAVENOUS

## 2023-11-27 MED ORDER — DEXAMETHASONE SODIUM PHOSPHATE 4 MG/ML IJ SOLN
INTRAMUSCULAR | Status: DC | PRN
  Administered 2023-11-27: 8 mg via INTRAVENOUS

## 2023-11-27 MED ORDER — BUPIVACAINE HCL (PF) 0.25 % IJ SOLN
INTRAMUSCULAR | Status: DC | PRN
  Administered 2023-11-27: 9 mL

## 2023-11-27 SURGICAL SUPPLY — 42 items
BANDAGE GAUZE 1X75IN STRL (MISCELLANEOUS) IMPLANT
BENZOIN TINCTURE PRP APPL 2/3 (GAUZE/BANDAGES/DRESSINGS) IMPLANT
BLADE MINI RND TIP GREEN BEAV (BLADE) IMPLANT
BLADE SURG 15 STRL LF DISP TIS (BLADE) ×2 IMPLANT
BNDG COHESIVE 1X5 TAN STRL LF (GAUZE/BANDAGES/DRESSINGS) IMPLANT
BNDG COHESIVE 2X5 TAN ST LF (GAUZE/BANDAGES/DRESSINGS) IMPLANT
BNDG COMPR ESMARK 4X3 LF (GAUZE/BANDAGES/DRESSINGS) IMPLANT
BNDG ELASTIC 2INX 5YD STR LF (GAUZE/BANDAGES/DRESSINGS) IMPLANT
BNDG ELASTIC 3INX 5YD STR LF (GAUZE/BANDAGES/DRESSINGS) IMPLANT
BNDG GAUZE DERMACEA FLUFF 4 (GAUZE/BANDAGES/DRESSINGS) IMPLANT
BNDG PLASTER X FAST 3X3 WHT LF (CAST SUPPLIES) IMPLANT
CHLORAPREP W/TINT 26 (MISCELLANEOUS) ×1 IMPLANT
CORD BIPOLAR FORCEPS 12FT (ELECTRODE) ×1 IMPLANT
COVER BACK TABLE 60X90IN (DRAPES) ×1 IMPLANT
COVER MAYO STAND STRL (DRAPES) ×1 IMPLANT
CUFF TOURN SGL QUICK 18X4 (TOURNIQUET CUFF) ×1 IMPLANT
DRAPE EXTREMITY T 121X128X90 (DISPOSABLE) ×1 IMPLANT
DRAPE SURG 17X23 STRL (DRAPES) ×1 IMPLANT
GAUZE SPONGE 4X4 12PLY STRL (GAUZE/BANDAGES/DRESSINGS) ×1 IMPLANT
GAUZE STRETCH 2X75IN STRL (MISCELLANEOUS) IMPLANT
GAUZE XEROFORM 1X8 LF (GAUZE/BANDAGES/DRESSINGS) ×1 IMPLANT
GLOVE BIO SURGEON STRL SZ7 (GLOVE) IMPLANT
GLOVE BIO SURGEON STRL SZ7.5 (GLOVE) ×1 IMPLANT
GLOVE BIOGEL PI IND STRL 7.0 (GLOVE) IMPLANT
GLOVE BIOGEL PI IND STRL 8 (GLOVE) ×1 IMPLANT
GLOVE SURG SS PI 6.5 STRL IVOR (GLOVE) IMPLANT
GOWN STRL REUS W/ TWL LRG LVL3 (GOWN DISPOSABLE) ×1 IMPLANT
NDL HYPO 25X1 1.5 SAFETY (NEEDLE) ×1 IMPLANT
NEEDLE HYPO 25X1 1.5 SAFETY (NEEDLE) ×1 IMPLANT
NS IRRIG 1000ML POUR BTL (IV SOLUTION) ×1 IMPLANT
PACK BASIN DAY SURGERY FS (CUSTOM PROCEDURE TRAY) ×1 IMPLANT
PAD CAST 3X4 CTTN HI CHSV (CAST SUPPLIES) IMPLANT
PAD CAST 4YDX4 CTTN HI CHSV (CAST SUPPLIES) IMPLANT
PADDING CAST ABS COTTON 4X4 ST (CAST SUPPLIES) ×1 IMPLANT
STOCKINETTE 4X48 STRL (DRAPES) ×1 IMPLANT
STRIP CLOSURE SKIN 1/2X4 (GAUZE/BANDAGES/DRESSINGS) IMPLANT
SUT ETHILON 3 0 PS 1 (SUTURE) IMPLANT
SUT ETHILON 4 0 PS 2 18 (SUTURE) ×1 IMPLANT
SYR BULB EAR ULCER 3OZ GRN STR (SYRINGE) ×1 IMPLANT
SYR CONTROL 10ML LL (SYRINGE) ×1 IMPLANT
TOWEL GREEN STERILE FF (TOWEL DISPOSABLE) ×2 IMPLANT
UNDERPAD 30X36 HEAVY ABSORB (UNDERPADS AND DIAPERS) ×1 IMPLANT

## 2023-11-27 NOTE — Op Note (Signed)
 SURGERY ASSISTANT NOTE  PLACE OF SERVICE: [facility]   PATIENT INFORMATION: Name: Heather Griffin MRN#: [id number] DOB: May 15, 1960  Date of surgery: 11/27/2023 Time of surgery: 2:06 PM  SURGERY ASSISTANT NOTE:  Assistant name: Isaiah Anton, PA-C Note date: 11/27/2023  I assisted Dr. Franky Curia on the following procedure(s) for the above-noted patient in the date and time documented:   Right index finger excision of mucoid cyst and debridement of DIP joint    I provided assistance on the case as follows:  Assistance with exposure, retraction, bleeding control, protection of vital structures, instrumentation  and closure.   Isaiah Anton, PA-C

## 2023-11-27 NOTE — H&P (Signed)
 Heather Griffin is an 63 y.o. female.   Chief Complaint: mucoid cyst HPI: 63 yo female with right index finger mucoid cyst and dip joint arthritis.  It is bothersome to her.  She wishes to have the cyst removed and the dip joint debrided to try to prevent recurrence.  Allergies:  Allergies  Allergen Reactions   Erythromycin Nausea And Vomiting    Past Medical History:  Diagnosis Date   Arthritis    Hyperlipidemia    Hyperthyroidism    h/o hyper - 1995   Infections of kidney    x 2 strep inf of kidney- 1978 and 1982   Mucoid cyst of joint    left index finger    Past Surgical History:  Procedure Laterality Date   ABDOMINAL HYSTERECTOMY  05/16/2011   Procedure: HYSTERECTOMY ABDOMINAL;  Surgeon: Lynwood FORBES Curlene PONCE, MD;  Location: [facility];  Service: Gynecology;  Laterality: N/A;   ARTHROTOMY Left 05/12/2023   Procedure: ARTHROTOMY;  Surgeon: Murrell Drivers, MD;  Location: [facility];  Service: Orthopedics;  Laterality: Left;  debridement of IP joint.   CYST REMOVAL HAND Left 05/12/2023   Procedure: REMOVAL, CYST, HAND;  Surgeon: Murrell Drivers, MD;  Location: [facility];  Service: Orthopedics;  Laterality: Left;   DISTAL INTERPHALANGEAL JOINT FUSION Left 12/28/2017   Procedure: FUSION DISTAL INTERPHALANGEAL JOINT  LEFT INDEX AND EXCISION MUCOID CYST;  Surgeon: Murrell Kuba, MD;  Location: [facility];  Service: Orthopedics;  Laterality: Left;  AXILLARY BLOCK   KNEE ARTHROTOMY     MASS EXCISION Left 08/10/2017   Procedure: EXCISION MUCOID CYST WITH DISTAL INTERPHALANGEAL JOINT ARTHROTOMY LEFT INDEX FINGER;  Surgeon: Murrell Kuba, MD;  Location: [facility];  Service: Orthopedics;  Laterality: Left;  FAB   SALPINGOOPHORECTOMY  05/16/2011   Procedure: SALPINGO OOPHERECTOMY;  Surgeon: Lynwood FORBES Curlene PONCE, MD;  Location: [facility];  Service: Gynecology;  Laterality: Bilateral;   TONSILLECTOMY     TUBAL LIGATION  1993    Family History: Family  History  Problem Relation Age of Onset   Hyperlipidemia Mother    Cancer Mother    Hyperlipidemia Father    Rheum arthritis Brother    Stroke Paternal Grandfather     Social History:   reports that she has never smoked. She has never used smokeless tobacco. She reports current alcohol use of about 2.0 standard drinks of alcohol per week. She reports that she does not use drugs.  Medications: Medications Prior to Admission  Medication Sig Dispense Refill   atorvastatin  (LIPITOR) 20 MG tablet Take 1 tablet (20 mg total) by mouth daily. 90 tablet 3   ezetimibe  (ZETIA ) 10 MG tablet Take 0.5 tablets (5 mg total) by mouth daily. 45 tablet 3   ibuprofen (ADVIL,MOTRIN) 200 MG tablet Take 600 mg by mouth 2 (two) times daily.     HYDROcodone -acetaminophen  (NORCO/VICODIN) 5-325 MG tablet Take 1 tablet by mouth every 6 (six) hours as needed for moderate pain (pain score 4-6). 15 tablet 0    No results found for this or any previous visit (from the past 48 hours).  No results found.    Blood pressure (!) 180/90, pulse 76, temperature 97.8 F (36.6 C), temperature source Temporal, resp. rate 16, height 5' 5 (1.651 m), weight 65.2 kg, last menstrual period 03/18/2011, SpO2 100%.  General appearance: alert, cooperative, and appears stated age Head: Normocephalic, without obvious abnormality, atraumatic Neck: supple, symmetrical, trachea midline Extremities: Intact capillary refill all  digits.  +epl/fpl/io.  No wounds.  Skin: Skin color, texture, turgor normal. No rashes or lesions Neurologic: Grossly normal Incision/Wound: none  Assessment/Plan Right index finger mucoid cyst and dip arthritis.  Non operative and operative treatment options have been discussed with the patient and patient wishes to proceed with operative treatment. Risks, benefits, and alternatives of surgery have been discussed and the patient agrees with the plan of care.   Heather Griffin 11/27/2023, 1:12 PM

## 2023-11-27 NOTE — Transfer of Care (Signed)
 Immediate Anesthesia Transfer of Care Note  Patient: Heather Griffin  Procedure(s) Performed: RIGHT INDEX FINGER EXCISION MUCOID CYST (Right: Index Finger) ARTHROTOMY (Right: Index Finger)  Patient Location: PACU  Anesthesia Type:General  Level of Consciousness: awake, alert , oriented, and patient cooperative  Airway & Oxygen Therapy: Patient Spontanous Breathing and Patient connected to nasal cannula oxygen  Post-op Assessment: Report given to RN and Post -op Vital signs reviewed and stable  Post vital signs: Reviewed and stable  Last Vitals:  Vitals Value Taken Time  BP 164/81 11/27/23 14:05  Temp    Pulse 73 11/27/23 14:07  Resp 16 11/27/23 14:07  SpO2 100 % 11/27/23 14:07  Vitals shown include unfiled device data.  Last Pain:  Vitals:   11/27/23 1154  TempSrc: Temporal  PainSc: 3       Patients Stated Pain Goal: 5 (11/27/23 1154)  Complications: No notable events documented.

## 2023-11-27 NOTE — Anesthesia Postprocedure Evaluation (Signed)
 Anesthesia Post Note  Patient: Heather Griffin  Procedure(s) Performed: RIGHT INDEX FINGER EXCISION MUCOID CYST (Right: Index Finger)     Patient location during evaluation: PACU Anesthesia Type: General Level of consciousness: awake and alert, oriented and patient cooperative Pain management: pain level controlled Vital Signs Assessment: post-procedure vital signs reviewed and stable Respiratory status: spontaneous breathing, nonlabored ventilation and respiratory function stable Cardiovascular status: blood pressure returned to baseline and stable Postop Assessment: no apparent nausea or vomiting Anesthetic complications: no   No notable events documented.  Last Vitals:  Vitals:   11/27/23 1405 11/27/23 1415  BP: (!) 164/81 (!) 161/86  Pulse: 78 70  Resp: 17 17  Temp: 36.5 C   SpO2: 100% 97%    Last Pain:  Vitals:   11/27/23 1405  TempSrc:   PainSc: 0-No pain                 Almarie CHRISTELLA Marchi

## 2023-11-27 NOTE — Anesthesia Procedure Notes (Signed)
 Procedure Name: LMA Insertion Date/Time: 11/27/2023 1:33 PM  Performed by: Donnell Berwyn SQUIBB, CRNAPre-anesthesia Checklist: Patient identified, Emergency Drugs available, Suction available, Patient being monitored and Timeout performed Patient Re-evaluated:Patient Re-evaluated prior to induction Oxygen Delivery Method: Circle system utilized Preoxygenation: Pre-oxygenation with 100% oxygen Induction Type: IV induction Ventilation: Mask ventilation without difficulty LMA: LMA inserted LMA Size: 4.0 Number of attempts: 1 Placement Confirmation: positive ETCO2 and breath sounds checked- equal and bilateral Tube secured with: Tape Dental Injury: Teeth and Oropharynx as per pre-operative assessment

## 2023-11-27 NOTE — Discharge Instructions (Addendum)

## 2023-11-27 NOTE — Op Note (Signed)
 NAME: Heather Griffin MEDICAL RECORD NO: [id number] DATE OF BIRTH: 05-Jul-1960 FACILITY: [facility] LOCATION: [facility] PHYSICIAN: Tyriana Helmkamp R. Jeree Delcid, MD   OPERATIVE REPORT   DATE OF PROCEDURE: 11/27/23    PREOPERATIVE DIAGNOSIS: Right index finger mucoid cyst and DIP joint arthritis   POSTOPERATIVE DIAGNOSIS: Right index finger mucoid cyst and DIP joint arthritis   PROCEDURE: 1.  Right index finger excision mucoid cyst 2.  Right index finger debridement of DIP joint including osteophyte from middle phalanx   SURGEON:  Franky Curia, M.D.   ASSISTANT: Isaiah Anton, [facility]   ANESTHESIA:  General   INTRAVENOUS FLUIDS:  Per anesthesia flow sheet.   ESTIMATED BLOOD LOSS:  Minimal.   COMPLICATIONS:  None.   SPECIMENS: Right index finger mucoid cyst to pathology   TOURNIQUET TIME:    Total Tourniquet Time Documented: Upper Arm (Right) - 13 minutes Total: Upper Arm (Right) - 13 minutes    DISPOSITION:  Stable to PACU.   INDICATIONS: 63 year old female with right index finger mucoid cyst.  It is bothersome to her.  She wishes to have the cyst removed and the DIP joint debrided to try to prevent recurrence.  Risks, benefits and alternatives of surgery were discussed including the risks of blood loss, infection, damage to nerves, vessels, tendons, ligaments, bone for surgery, need for additional surgery, complications with wound healing, continued pain, stiffness, , recurrence.  She voiced understanding of these risks and elected to proceed.  OPERATIVE COURSE:  After being identified preoperatively by myself,  the patient and I agreed on the procedure and site of the procedure.  The surgical site was marked.  Surgical consent had been signed. Preoperative IV antibiotic prophylaxis was given. She was transferred to the operating room and placed on the operating table in supine position with the right upper extremity on an arm board.  General anesthesia was induced by the  anesthesiologist.  Right upper extremity was prepped and draped in normal sterile orthopedic fashion.  A surgical pause was performed between the surgeons, anesthesia, and operating room staff and all were in agreement as to the patient, procedure, and site of procedure.  Tourniquet at the proximal aspect of the extremity was inflated to 250 mmHg after exsanguination of the arm with an Esmarch bandage.  A hockey-stick shaped incision was made at the dorsum of the DIP joint.  This was carried in subcutaneous tissues by spreading technique.  The cyst was identified.  Was cleared of soft tissue attachments.  It was filled with clear gelatinous fluid.  The synovectomy rongeurs were used to remove the cyst.  Was sent to pathology for examination.  The DIP joint was entered underneath the extensor tendon.  Further cyst was removed with the rongeurs.  There was an osteophyte at the dorsal radial aspect of the middle phalanx which was removed.  The wound and joint were irrigated with sterile saline.  The wound was closed with 4-0 nylon in a horizontal mattress fashion.  Was dressed with sterile Xeroform 4 x 4 and wrapped with a Coban dressing lightly.  AlumaFoam splint was placed and wrapped light with Coban dressing.  A digital block was performed with quarter percent plain Marcaine  to aid in postoperative analgesia.  The tourniquet was deflated at 13 minutes.  Fingertips were pink with brisk capillary refill after deflation of tourniquet.  The operative  drapes were broken down.  The patient was awoken from anesthesia safely.  She was transferred back to the stretcher and taken to  PACU in stable condition.  I will see her back in the office in 1 week for postoperative followup.  She states she has pain medications leftover from her previous surgery and plans to use these for postoperative pain control as necessary.   Seline Enzor, MD Electronically signed, 11/27/23

## 2023-11-28 ENCOUNTER — Encounter (HOSPITAL_BASED_OUTPATIENT_CLINIC_OR_DEPARTMENT_OTHER): Payer: Self-pay | Admitting: Orthopedic Surgery

## 2023-11-28 LAB — SURGICAL PATHOLOGY

## 2024-04-24 ENCOUNTER — Other Ambulatory Visit

## 2024-05-01 ENCOUNTER — Encounter: Admitting: Family Medicine
